# Patient Record
Sex: Male | Born: 1970 | Race: Black or African American | Hispanic: No | Marital: Single | Smoking: Former smoker
Health system: Southern US, Community
[De-identification: ages and names within clinical notes are randomized; demographics above are authoritative.]

## PROBLEM LIST (undated history)

## (undated) DIAGNOSIS — I10 Essential (primary) hypertension: Secondary | ICD-10-CM

## (undated) HISTORY — PX: TONSILLECTOMY: SUR1361

## (undated) HISTORY — DX: Essential (primary) hypertension: I10

---

## 2003-07-02 ENCOUNTER — Emergency Department (HOSPITAL_COMMUNITY): Admission: EM | Admit: 2003-07-02 | Discharge: 2003-07-02 | Payer: Self-pay | Admitting: Emergency Medicine

## 2003-07-13 ENCOUNTER — Ambulatory Visit (HOSPITAL_COMMUNITY): Admission: RE | Admit: 2003-07-13 | Discharge: 2003-07-13 | Payer: Self-pay | Admitting: Unknown Physician Specialty

## 2004-08-07 ENCOUNTER — Ambulatory Visit: Payer: Self-pay | Admitting: Family Medicine

## 2004-09-11 ENCOUNTER — Emergency Department (HOSPITAL_COMMUNITY): Admission: EM | Admit: 2004-09-11 | Discharge: 2004-09-11 | Payer: Self-pay | Admitting: Emergency Medicine

## 2004-09-15 ENCOUNTER — Ambulatory Visit: Payer: Self-pay | Admitting: Family Medicine

## 2004-10-28 ENCOUNTER — Ambulatory Visit: Payer: Self-pay | Admitting: Family Medicine

## 2005-08-11 ENCOUNTER — Ambulatory Visit: Payer: Self-pay | Admitting: Family Medicine

## 2005-10-09 ENCOUNTER — Ambulatory Visit: Payer: Self-pay | Admitting: Family Medicine

## 2007-02-06 ENCOUNTER — Emergency Department (HOSPITAL_COMMUNITY): Admission: EM | Admit: 2007-02-06 | Discharge: 2007-02-06 | Payer: Self-pay | Admitting: Emergency Medicine

## 2007-02-09 ENCOUNTER — Ambulatory Visit: Payer: Self-pay | Admitting: Internal Medicine

## 2007-02-09 ENCOUNTER — Observation Stay (HOSPITAL_COMMUNITY): Admission: EM | Admit: 2007-02-09 | Discharge: 2007-02-10 | Payer: Self-pay | Admitting: Emergency Medicine

## 2007-02-10 ENCOUNTER — Ambulatory Visit: Payer: Self-pay | Admitting: Internal Medicine

## 2007-06-16 ENCOUNTER — Ambulatory Visit: Payer: Self-pay | Admitting: Family Medicine

## 2007-10-14 ENCOUNTER — Emergency Department (HOSPITAL_COMMUNITY): Admission: EM | Admit: 2007-10-14 | Discharge: 2007-10-14 | Payer: Self-pay | Admitting: Emergency Medicine

## 2007-11-03 DIAGNOSIS — I10 Essential (primary) hypertension: Secondary | ICD-10-CM

## 2007-11-03 DIAGNOSIS — F411 Generalized anxiety disorder: Secondary | ICD-10-CM

## 2007-11-03 DIAGNOSIS — F1010 Alcohol abuse, uncomplicated: Secondary | ICD-10-CM | POA: Insufficient documentation

## 2007-11-29 ENCOUNTER — Ambulatory Visit: Payer: Self-pay | Admitting: Family Medicine

## 2007-11-29 ENCOUNTER — Encounter: Payer: Self-pay | Admitting: Family Medicine

## 2008-03-19 ENCOUNTER — Telehealth: Payer: Self-pay | Admitting: Family Medicine

## 2008-04-03 ENCOUNTER — Ambulatory Visit: Payer: Self-pay | Admitting: Family Medicine

## 2008-05-21 ENCOUNTER — Telehealth: Payer: Self-pay | Admitting: Family Medicine

## 2008-07-20 ENCOUNTER — Ambulatory Visit: Payer: Self-pay | Admitting: Family Medicine

## 2008-07-20 DIAGNOSIS — K219 Gastro-esophageal reflux disease without esophagitis: Secondary | ICD-10-CM | POA: Insufficient documentation

## 2008-07-21 DIAGNOSIS — R079 Chest pain, unspecified: Secondary | ICD-10-CM

## 2008-09-11 ENCOUNTER — Telehealth: Payer: Self-pay | Admitting: Family Medicine

## 2008-10-16 ENCOUNTER — Telehealth: Payer: Self-pay | Admitting: Family Medicine

## 2009-04-12 ENCOUNTER — Telehealth: Payer: Self-pay | Admitting: Family Medicine

## 2009-06-05 ENCOUNTER — Telehealth: Payer: Self-pay | Admitting: Family Medicine

## 2009-06-26 ENCOUNTER — Ambulatory Visit: Payer: Self-pay | Admitting: Family Medicine

## 2009-06-26 DIAGNOSIS — R5383 Other fatigue: Secondary | ICD-10-CM

## 2009-06-26 DIAGNOSIS — G4700 Insomnia, unspecified: Secondary | ICD-10-CM

## 2009-06-26 DIAGNOSIS — R5381 Other malaise: Secondary | ICD-10-CM

## 2009-07-09 ENCOUNTER — Telehealth: Payer: Self-pay | Admitting: Family Medicine

## 2010-01-06 ENCOUNTER — Telehealth: Payer: Self-pay | Admitting: Family Medicine

## 2010-02-07 ENCOUNTER — Ambulatory Visit: Payer: Self-pay | Admitting: Family Medicine

## 2010-06-24 NOTE — Progress Notes (Signed)
Summary: BP MEDS  Phone Note Call from Patient   Summary of Call: NEEDS HIS BP MEDICINE FILLED AND HE CAN NOT GET AN APPT TILL 2.3.11 @ 8:00 AM [facility]  PLEASE CALL HIM AND LET HIM KNOW ABOUT THE REFILL AT [phone] Initial call taken by: Lind Guest,  June 05, 2009 2:31 PM  Follow-up for Phone Call        Phone Call Completed, Rx Called In Follow-up by: Worthy Keeler LPN,  June 05, 2009 2:33 PM    Prescriptions: FLUOXETINE HCL 10 MG TABS (FLUOXETINE HCL) Take 1 tablet by mouth once a day  #30 Each x 0   Entered by:   Worthy Keeler LPN   Authorized by:   Syliva Overman MD   Signed by:   Worthy Keeler LPN on 11/91/4782   Method used:   Electronically to        [facility]  [address] (retail)       [address]       [place], [place]  [address]       Ph: [phone]       Fax: [phone]   RxID:   [id number] LISINOPRIL-HYDROCHLOROTHIAZIDE 20-25 MG  TABS (LISINOPRIL-HYDROCHLOROTHIAZIDE) Take 1 tablet by mouth once a day  #30 Each x 0   Entered by:   Worthy Keeler LPN   Authorized by:   Syliva Overman MD   Signed by:   Worthy Keeler LPN on 47/42/5956   Method used:   Electronically to        [facility]  [address] (retail)       [address]       [place], [place]  [address]       Ph: [phone]       Fax: [phone]   RxID:   [id number]

## 2010-06-24 NOTE — Assessment & Plan Note (Signed)
Summary: follow up- room 1   Vital Signs:  Patient profile:   40 year old Cody [name] Height:      69.5 inches Weight:      166.75 pounds BMI:     24.36 O2 Sat:      98 % on Room air Pulse rate:   90 / minute Resp:     16 per minute BP sitting:   126 / 84  (left arm)  Vitals Entered By: Adella Hare LPN (February 07, 2010 9:22 AM) CC: follow-up visit Is Patient Diabetic? No Pain Assessment Patient in pain? no      Comments did not bring meds to ov   Primary Provider:  Syliva Overman MD  CC:  follow-up visit.  History of Present Illness: Pt presents today for check up. No complaints or concerns.  Overall feeling well.  Hx of htn. Taking medications as prescribed and denies side effects.  No headache, chest pain or palpitations.  States has been off of Fluoxetine for a couple of mos and is doing well.  Has not been taking sleeping pills either. Pt states he is not feeling depressed, and is sleeping well.  Has cut his ETOH back significantly. Only drinks 2 regular size cans of beer per night now.  Pt has not gotten labs drawn due to expense.  He has no medical ins.  He is applying for the [employer].    Allergies (verified): 1)  ! * Citolapram  Past History:  Past medical history reviewed for relevance to current acute and chronic problems.  Past Medical History: Reviewed history from 05/03/2007 and no changes required. ALCOHOL USE (ICD-305.00) GENERALIZED ANXIETY DISORDER (ICD-300.02) HYPERTENSION (ICD-401.9)  Review of Systems General:  Denies chills, fever, and loss of appetite. ENT:  Denies earache, nasal congestion, and sore throat. CV:  Denies chest pain or discomfort, lightheadness, and palpitations. Resp:  Denies cough and shortness of breath. GI:  Denies abdominal pain, indigestion, loss of appetite, nausea, and vomiting. Neuro:  Denies headaches. Psych:  Denies anxiety, depression, easily tearful, irritability, suicidal thoughts/plans, and thoughts  /plans of harming others.  Physical Exam  General:  Well-developed,well-nourished,in no acute distress; alert,appropriate and cooperative throughout examination Head:  Normocephalic and atraumatic without obvious abnormalities. No apparent alopecia or balding. Ears:  External ear exam shows no significant lesions or deformities.  Otoscopic examination reveals clear canals, tympanic membranes are intact bilaterally without bulging, retraction, inflammation or discharge. Hearing is grossly normal bilaterally. Nose:  External nasal examination shows no deformity or inflammation. Nasal mucosa are pink and moist without lesions or exudates. Mouth:  Oral mucosa and oropharynx without lesions or exudates.   Neck:  No deformities, masses, or tenderness noted. Lungs:  Normal respiratory effort, chest expands symmetrically. Lungs are clear to auscultation, no crackles or wheezes. Heart:  Normal rate and regular rhythm. S1 and S2 normal without gallop, murmur, click, rub or other extra sounds. Cervical Nodes:  No lymphadenopathy noted Psych:  Cognition and judgment appear intact. Alert and cooperative with normal attention span and concentration. No apparent delusions, illusions, hallucinations   Impression & Recommendations:  Problem # 1:  HYPERTENSION (ICD-401.9) Assessment Comment Only  His updated medication list for this problem includes:    Lisinopril-hydrochlorothiazide 20-Cody Mg Tabs (Lisinopril-hydrochlorothiazide) .Marland Kitchen... Take 1 tablet by mouth once a day  BP today: 126/84 Prior BP: 120/82 (06/26/2009)  Problem # 2:  ALCOHOL USE (ICD-305.00) Assessment: Improved  Complete Medication List: 1)  Lisinopril-hydrochlorothiazide 20-Cody Mg Tabs (Lisinopril-hydrochlorothiazide) .... Take 1  tablet by mouth once a day  Patient Instructions: 1)  Please schedule a follow-up appointment in 4 months. 2)  Continue taking your blood pressure medicine daily. 3)  Consider having your lab work drawn.  If  you qualify for the [facility] Discount this will apply to the lab work too. Prescriptions: LISINOPRIL-HYDROCHLOROTHIAZIDE 20-Cody MG  TABS (LISINOPRIL-HYDROCHLOROTHIAZIDE) Take 1 tablet by mouth once a day  #30 x 5   Entered and Authorized by:   Esperanza Sheets PA   Signed by:   Esperanza Sheets PA on 02/07/2010   Method used:   Electronically to        [facility]  [address] (retail)       [address]       [place], [place]  [address]       Ph: [phone]       Fax: [phone]   RxID:   [id number]  Pt declined Flu Vac. Esperanza Sheets PA  February 07, 2010 9:38 AM

## 2010-06-24 NOTE — Assessment & Plan Note (Signed)
Summary: NEEDS MEDICINE   Vital Signs:  Patient profile:   40 year old male Height:      69.5 inches Weight:      165 pounds BMI:     24.10 O2 Sat:      97 % Pulse rate:   90 / minute Pulse rhythm:   regular Resp:     16 per minute BP sitting:   120 / 82 Cuff size:   regular  Vitals Entered By: Everitt Amber (June 26, 2009 8:14 AM) CC: Follow up chronic problems Is Patient Diabetic? No   Primary Care Provider:  Syliva Overman MD  CC:  Follow up chronic problems.  History of Present Illness: Reports  that he has been doing very well. Denies recent fever or chills. Denies sinus pressure, nasal congestion , ear pain or sore throat. Denies chest congestion, or cough productive of sputum. Denies chest pain, palpitations, PND, orthopnea or leg swelling. Denies abdominal pain, nausea, vomitting, diarrhea or constipation. Denies change in bowel movements or bloody stool. Denies dysuria , frequency, incontinence or hesitancy. Denies  joint pain, swelling, or reduced mobility. Denies headaches, vertigo, seizures. Denies depression, anxiety or insomnia.Pt states that he is doing very well on his medication , and wants to continue sAME. Denies  rash, lesions, or itch.     Current Medications (verified): 1)  Lisinopril-Hydrochlorothiazide 20-25 Mg  Tabs (Lisinopril-Hydrochlorothiazide) .... Take 1 Tablet By Mouth Once A Day 2)  Fluoxetine Hcl 10 Mg Tabs (Fluoxetine Hcl) .... Take 1 Tablet By Mouth Once A Day  Allergies (verified): 1)  ! * Citolapram  Review of Systems      See HPI General:  Complains of sleep disorder; POOR SLEEP. Eyes:  Denies blurring and discharge. Endo:  Denies cold intolerance, excessive hunger, excessive thirst, excessive urination, heat intolerance, polyuria, and weight change. Heme:  Denies abnormal bruising and bleeding. Allergy:  Denies hives or rash and itching eyes.  Physical Exam  General:  Well-developed,well-nourished,in no acute  distress; alert,appropriate and cooperative throughout examination HEENT: No facial asymmetry,  EOMI, No sinus tenderness, TM's Clear, oropharynx  pink and moist.   Chest: Clear to auscultation bilaterally.  CVS: S1, S2, No murmurs, No S3.   Abd: Soft, Nontender.  MS: Adequate ROM spine, hips, shoulders and knees.  Ext: No edema.   CNS: CN 2-12 intact, power tone and sensation normal throughout.   Skin: Intact, no visible lesions or rashes.  Psych: Good eye contact, normal affect.  Memory intact, not anxious or depressed appearing.      Impression & Recommendations:  Problem # 1:  HYPERTENSION (ICD-401.9) Assessment Improved  His updated medication list for this problem includes:    Lisinopril-hydrochlorothiazide 20-25 Mg Tabs (Lisinopril-hydrochlorothiazide) .Marland Kitchen... Take 1 tablet by mouth once a day  Orders: T-Basic Metabolic Panel [id number])  BP today: 120/82 Prior BP: 120/90 (07/20/2008)  Problem # 2:  GENERALIZED ANXIETY DISORDER (ICD-300.02) Assessment: Improved  His updated medication list for this problem includes:    Fluoxetine Hcl 10 Mg Tabs (Fluoxetine hcl) .Marland Kitchen... Take 1 tablet by mouth once a day  Problem # 3:  GERD (ICD-530.81) Assessment: Unchanged  The following medications were removed from the medication list:    Omeprazole 40 Mg Cpdr (Omeprazole) .Marland Kitchen... Take 1 capsule by mouth once a day  Problem # 4:  ALCOHOL USE (ICD-305.00) Assessment: Improved  Problem # 5:  INSOMNIA (ICD-780.52) Assessment: Improved  His updated medication list for this problem includes:    Temazepam 15 Mg  Caps (Temazepam) .Marland Kitchen... Take 1 capsule by mouth at bedtime  Complete Medication List: 1)  Lisinopril-hydrochlorothiazide 20-25 Mg Tabs (Lisinopril-hydrochlorothiazide) .... Take 1 tablet by mouth once a day 2)  Fluoxetine Hcl 10 Mg Tabs (Fluoxetine hcl) .... Take 1 tablet by mouth once a day 3)  Temazepam 15 Mg Caps (Temazepam) .... Take 1 capsule by mouth at  bedtime  Other Orders: T-Lipid Profile ([id number]) T-TSH (62130-86578) T-CBC w/Diff [phone])  Patient Instructions: 1)  CPE in 4.5 months 2)  BMP prior to visit, ICD-9: 3)  Lipid Panel prior to visit, ICD-9:   fasting 4)  TSH prior to visit, ICD-9: 5)  CBC w/ Diff prior to visit, ICD-9: 6)  New med for sleep. 7)  PLS cut down to at most 1 beer at night, then plan on quitting by 02/14, you will get med to help you rest Prescriptions: FLUOXETINE HCL 10 MG TABS (FLUOXETINE HCL) Take 1 tablet by mouth once a day  #30 Each x 5   Entered by:   Everitt Amber   Authorized by:   Syliva Overman MD   Signed by:   Everitt Amber on 06/26/2009   Method used:   Electronically to        [facility]  [address] (retail)       [address]       [place], [place]  [address]       Ph: [phone]       Fax: [phone]   RxID:   [id number] LISINOPRIL-HYDROCHLOROTHIAZIDE 20-25 MG  TABS (LISINOPRIL-HYDROCHLOROTHIAZIDE) Take 1 tablet by mouth once a day  #30 Each x 5   Entered by:   Everitt Amber   Authorized by:   Syliva Overman MD   Signed by:   Everitt Amber on 06/26/2009   Method used:   Electronically to        [facility]  [address] (retail)       [address]       [place], [place]  [address]       Ph: [phone]       Fax: [phone]   RxID:   [id number] TEMAZEPAM 15 MG CAPS (TEMAZEPAM) Take 1 capsule by mouth at bedtime  #30 x 4   Entered and Authorized by:   Syliva Overman MD   Signed by:   Syliva Overman MD on 06/26/2009   Method used:   Printed then faxed to ...       [facility]  [address] (retail)       [address]       [place], [place]  [address]       Ph: [phone]       Fax: [phone]   RxID:   [id number]

## 2010-06-24 NOTE — Progress Notes (Signed)
Summary: refill on rx  Phone Note Call from Patient   Summary of Call: needs a refill called into [facility] for blood pressure meds. he ask them to fax too. [phone] Initial call taken by: Rudene Anda,  July 09, 2009 11:47 AM    Prescriptions: LISINOPRIL-HYDROCHLOROTHIAZIDE 20-25 MG  TABS (LISINOPRIL-HYDROCHLOROTHIAZIDE) Take 1 tablet by mouth once a day  #30 Each x 5   Entered by:   Lilyan Gilford LPN   Authorized by:   Syliva Overman MD   Signed by:   Lilyan Gilford LPN on 45/40/9811   Method used:   Electronically to        [facility]  [address] (retail)       [address]       [place], [place]  [address]       Ph: [phone]       Fax: [phone]   RxID:   [id number]

## 2010-06-24 NOTE — Progress Notes (Signed)
Summary: refill   Phone Note Call from Patient   Summary of Call: pt needs autherzation on his blood pressure meds.  [facility]  [phone] Initial call taken by: Rudene Anda,  January 06, 2010 4:14 PM  Follow-up for Phone Call        advised patient needs appt Follow-up by: Adella Hare LPN,  January 07, 2010 9:25 AM

## 2010-08-06 ENCOUNTER — Telehealth (INDEPENDENT_AMBULATORY_CARE_PROVIDER_SITE_OTHER): Payer: Self-pay | Admitting: *Deleted

## 2010-08-12 NOTE — Progress Notes (Signed)
Summary: refill  Phone Note Call from Patient   Summary of Call: pt needs a refill on rx for blood pressure. [facility] [phone] Initial call taken by: Rudene Anda,  August 06, 2010 3:25 PM  Follow-up for Phone Call        please have patient schedule appt first Follow-up by: Adella Hare LPN,  August 06, 2010 3:36 PM  Additional Follow-up for Phone Call Additional follow up Details #1::        called and left message for pt to call office to make a doc appt. before he could get any refills Additional Follow-up by: Rudene Anda,  August 07, 2010 10:36 AM

## 2010-10-07 NOTE — H&P (Signed)
NAMEPAOLO, Cody [name]             ACCOUNT NO.:  [id number]   MEDICAL RECORD NO.:  [id number]          PATIENT TYPE:  OBV   LOCATION:  A223                          FACILITY:  [facility]   PHYSICIAN:  Skeet Latch, DO    DATE OF BIRTH:  11/10/1970   DATE OF ADMISSION:  02/09/2007  DATE OF DISCHARGE:  LH                              HISTORY & PHYSICAL   PRIMARY CARE PHYSICIAN:  Dr. Lodema Hong.   CHIEF COMPLAINT:  Vomiting, headache.   HISTORY OF PRESENT ILLNESS:  This is a 40 year old African-American male  who presented after having bloody emesis. The patient states last night  after work he had a few drinks and began to experience severe headaches.  The patient states that he came to [facility] emergency room on  February 06, 2007 with the complaints of a headache and was given a  prescription for blood pressure medication and sent home. The patient  states that he has had a headache consistently since then that has been  waxing and waning and states that last night he also began experiencing  tingling and numbness of his left arm and feet. The patient states that  after these symptoms he just fell asleep, and he when awoke this morning  he still had the sensation of headache and began to have the bloody  emesis. The patient came to the emergency room for evaluation. He was  found to have hematemesis in the emergency room, and we were called for  evaluation.   The patient's past medical history includes:  1. Hypertension.  2. ETOH abuse.  3. Anxiety.   PAST SURGICAL HISTORY:  Tonsillectomy.   MEDICATIONS:  Hydrochlorothiazide 25 mg p.o. daily.   SOCIAL HISTORY:  States he smokes 3 to 4 cigarettes a day for the past  six months. States that he quit smoking a few years ago and restarted  approximately 6 months ago. The patient states he drinks beer and a  small amount of liquor on a daily basis for quite some time also. She  denies any illicit drug use.   FAMILY HISTORY:   Positive for hypertension.   ALLERGIES:  No known drug allergies.   REVIEW OF SYSTEMS:  GENERAL:  Has slight decrease in appetite. No weight  loss, fevers, chills, dizziness. HEENT:  No ear pain, hoarseness, sinus  pressure, sore throat, diplopia, eye pain. RESPIRATORY:  Denies any  coughing, wheezing. GASTROINTESTINAL:  Positive nausea and vomiting. No  abdominal pain. GENITOURINARY:  No dysuria, flank pain or frequency.  MUSCULOSKELETAL:  No arthritis, arthralgias, neck pain. NEUROLOGICAL:  Positive for occipital headache. No confusion or lightheadedness. All  other systems negative.   PHYSICAL EXAMINATION:  VITAL SIGNS:  The last vitals in the emergency  room:  Temperature was 98.3, respirations 20, pulse 76, blood pressure  129/89.  HEENT:  Head is atraumatic and normocephalic. No scleral icterus.  PERRLA. EOMI. Oral mucosa is moist.  NECK:  Soft, supple, nontender, nondistended.  GENERAL:  He is well-developed __________ well-nourished in no acute  distress.  CARDIOVASCULAR:  Regular rate and rhythm. No murmurs, rubs, or gallops.  RESPIRATORY:  Lungs were clear to auscultation bilaterally. No rubs,  rhonchi or wheezes.  ABDOMEN:  Soft, nontender, nondistended. No masses, guarding, rebound,  tenderness or rigidity. Good bowel sounds.  EXTREMITIES:  No clubbing, cyanosis, or edema. Full range of motion.  NEUROLOGICAL:  Cranial nerves II-XII are grossly intact. Good sensation.  Good strength bilaterally.  SKIN:  No petechiae, rashes.   LABORATORY DATA:  PT 12.2, INR 0.9. Sodium 140, potassium 3.3, chloride  99, CO2 26, glucose 94, BUN 7, creatinine 1.02. Lipase is 21, amylase is  123. Urine drug screen is negative. His white count 3.8, hemoglobin  15.4, hematocrit 43.9, platelets 288. His alcohol level was less than 4.   ASSESSMENT:  1. Hematemesis.  2. Alcohol abuse.  3. Hypertension.  4. Anxiety.   PLAN:  The patient will be admitted to medical floor. The patient will   be kept n.p.o. at this time. The patient will have IV hydration and will  receive no aspirin or NSAIDs at this time. The patient will be placed on  PPI IV. GI has been consulted regarding need for possible EGD, also. The  patient will be placed on his home medications as well as medications  for pain at this time. I feel patient more than likely has gastritis-  type picture, but we need to rule out for varices at this time.  A.m.  labs will include CBC, BMP, PT, PTT, and as stated, the patient will be  kept n.p.o. pending his EGD. Anticipate patient will be discharged in  the next 24 or 48 hours if he remains stable and his GI evaluation is  unremarkable.      Skeet Latch, DO  Electronically Signed     SM/MEDQ  D:  02/09/2007  T:  02/10/2007  Job:  [id number]   cc:   Milus Mallick. Lodema Hong, M.D.  Fax: [phone]

## 2010-10-07 NOTE — Consult Note (Signed)
[name], Cody [name]             ACCOUNT NO.:  [id number]   MEDICAL RECORD NO.:  [id number]          PATIENT TYPE:  OBV   LOCATION:  A223                          FACILITY:  [facility]   PHYSICIAN:  R. Roetta Sessions, M.D. DATE OF BIRTH:  Apr 10, 1971   DATE OF CONSULTATION:  02/09/2007  DATE OF DISCHARGE:                                 CONSULTATION   REASON FOR CONSULTATION:  Upper GI bleed.   REFERRING PHYSICIAN:  [facility].   HISTORY OF PRESENT ILLNESS:  The patient is a 40 year old gentleman with  a history of alcohol abuse who presented with complaints of nausea,  vomiting, not feeling well with numbness in his left upper extremity and  his feet. He admitted to drinking significant amounts of alcohol and he  thought he may have alcohol poisoning. While he was in the emergency  department, he was noted the have gross hematemesis. He states he  consumes 72 ounces of beer daily and 1 or 2 shots of liquor daily. He  has done this for over 5 years. Prior to this, he states he drank even  more heavily. He denies any illegal drug use. He smokes 1 pack of  cigarettes every week. He denies any chronic heartburn, chronic nausea  and vomiting, abdominal pain, melena, rectal bleeding, constipation and  diarrhea. No reported weight loss.   CURRENT MEDICATIONS:  HCTZ 25 mg daily.   ALLERGIES:  No known drug allergies.   PAST MEDICAL HISTORY:  1. Hypertension.  2. Alcohol abuse.  3. Tobacco abuse.  4. Tonsillectomy.  5. Anxiety. History of panic attacks.   FAMILY HISTORY:  Positive for hypertension, negative for colorectal  cancer or liver disease, chronic GI illnesses.   SOCIAL HISTORY:  Single, has a 76-year-old daughter. 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 He smokes 1 pack of  cigarettes over a 1 week period of time. Alcohol abuse as above. No  illegal drug use.   REVIEW OF SYSTEMS:  See HPI for GI and constitutional. CARDIOPULMONARY:  Denies  chest pain or shortness of breath.   PHYSICAL EXAMINATION:  Temperature 98.3, pulse 76, respirations 20,  blood pressure 129/89.  GENERAL:  Pleasant, thin, black male in no acute distress.  SKIN:  Warm and dry, no jaundice.  HEENT:  Sclera nonicteric. Oropharyngeal mucosa moist and pink. No  lesions, erythema or exudates, no lymphadenopathy or thyromegaly.  CHEST:  Lungs are clear to auscultation.  CARDIAC:  Reveals a regular rate and rhythm, normal S1, S2. No murmurs,  rubs or gallops.  ABDOMEN:  Positive bowel sounds. Soft, nontender, nondistended. No  organomegaly or masses. No rebound tenderness or guarding. No abdominal  bruits. No hernias.  EXTREMITIES:  No edema.   LABORATORY DATA:  White count 3800, hemoglobin 16.4, hematocrit 43.9,  platelets 288,000. INR 0.9, sodium 140, potassium 3.3. BUN 7, creatinine  1.02, glucose 94. Alcohol level 74, lipase 21, amylase 123.   IMPRESSION:  The patient is a 40 year old gentleman with alcohol abuse.  He is admitted with nausea and vomiting, hematemesis. H&H is currently  normal.  Differential diagnosis includes Mallory-Weiss tear versus peptic  ulcer disease. I doubt we are dealing with esophageal varices.   RECOMMENDATIONS:  1. EGD in the morning.  2. PPI therapy.  3. Consider DT protocol.   I would like thank the [facility] for allowing [place] to take part in  the care of this patient.      Tana Coast, P.AJonathon Bellows, M.D.  Electronically Signed    LL/MEDQ  D:  02/09/2007  T:  02/09/2007  Job:  [id number]   cc:   Milus Mallick. Lodema Hong, M.D.  Fax: [phone]   [facility]

## 2010-10-07 NOTE — Op Note (Signed)
[name], Cody [name]             ACCOUNT NO.:  [id number]   MEDICAL RECORD NO.:  [id number]          PATIENT TYPE:  OBV   LOCATION:  A223                          FACILITY:  [facility]   PHYSICIAN:  R. Roetta Sessions, M.D. DATE OF BIRTH:  08/27/1970   DATE OF PROCEDURE:  02/10/2007  DATE OF DISCHARGE:                               OPERATIVE REPORT   INDICATIONS FOR PROCEDURE:  A 40 year old African-American male with  ongoing EtOH abuse who presents with nausea, vomiting, small volume  hematemesis.  H&H has remained normal overnight.  His hemoglobin this  morning is 14.5.  He has also remained entirely stable.  EGD is now  being done.  This approach has discussed the patient at length.  Potential risks, benefits and alternatives have been reviewed, questions  answered.  He is agreeable. Please see documentation in the medical  record.   PROCEDURE NOTE:  O2 saturation, blood pressure, pulse and respirations  monitored throughout the entire procedure.  Conscious sedation with  Versed 4 mg IV, Demerol 100 mg IV in divided doses.  Cetacaine spray for  topical oropharyngeal anesthesia.   INSTRUMENT:  Pentax video chip system.   FINDINGS:  Examination of the tubular esophagus revealed patulous EG  junction and 4-cm distal esophageal erosions.  There were no varices, no  Barrett's esophagus and no Mallory-Weiss tears.  No blood in the  esophagus.  EG junction easily traversed.   Stomach:  Likewise was no blood in the stomach.  Stomach was emptied and  insufflated well with air.  Throughout examination of the gastric mucosa  including retroflexion to view the proximal stomach and esophagogastric  junction demonstrated moderate size hiatal hernia and some submucosal  petechial hemorrhage in the area of the body mucosa.  There was no ulcer  or other abnormality.  Pylorus was patent and easily traversed.  Examination of the bulb and second portion revealed no abnormalities.   THERAPEUTIC/DIAGNOSTIC MANEUVERS PERFORMED:  None.   The patient tolerated the procedure well and was reactive to endoscopy.   IMPRESSION:  1. Multiple distal esophageal erosions consistent with erosive reflux      esophagitis, otherwise normal esophagus/patulous EG junction.  2. Moderate size hiatal hernia, submucosal gastric hemorrhage,      otherwise normal stomach, patent pylorus normal, normal D1/D2.   I suspect the patient has sustained a trivial upper GI bleed.  He has  likely had a Mallory-Weiss tear equivalent with some trauma to the  proximal gastric mucosa related to heaving/vomiting.   RECOMMENDATIONS:  1. I have frankly told him to avoid alcohol entirely.  2. PPI indefinitely.  3. Advance to regular diet.  4. Home anytime.   I would like to thank the [facility] Team and Dr. Bradd Canary for allowing me to see this nice gentleman today.      Jonathon Bellows, M.D.  Electronically Signed     RMR/MEDQ  D:  02/10/2007  T:  02/10/2007  Job:  [id number]   cc:   Alvin Critchley, M.D.  [address]  [place], [place] [address]   Hospitalist P Team

## 2010-10-10 NOTE — Discharge Summary (Signed)
Cody [name], Cody [name]             ACCOUNT NO.:  [id number]   MEDICAL RECORD NO.:  [id number]          PATIENT TYPE:  OBV   LOCATION:  A223                          FACILITY:  [facility]   PHYSICIAN:  Skeet Latch, DO    DATE OF BIRTH:  Jan 10, 1971   DATE OF ADMISSION:  02/09/2007  DATE OF DISCHARGE:  09/18/2008LH                               DISCHARGE SUMMARY   DISCHARGE DIAGNOSES:  1. Hematemesis.  2. Erosive reflux esophagitis.  3. EtOH abuse.  4. Hypertension.  5. Anxiety.   BRIEF HOSPITAL COURSE:  A 40 year old [place] American male who  presented after having multiple episodes of bloody emesis.  The patient  states that the evening before admission that after work he had a few  drinks began to experience severe headaches.  The patient states that  after that he came to the emergency room with the complaint of headache;  and was given a prescription for his blood pressure medication and sent  home.  The patient states that after he had a headache, that the  headaches persisted, and then he had an episode of some tingling and  numbness of his left arm and foot.  The patient states that he went to  bed.  He was awoken with this headache and started to be having some  bloody emesis.  The patient came to the ER for evaluation, and in the  emergency room he was found to have some emesis and we were called for  evaluation.  The patient does have a history of EtOH abuse.   Initial workup ethyl alcohol level of 74.  Initial CBC showed a white  count of 3.8, hemoglobin 15.4, hematocrit 43.9, platelet count 288.  His  urine drug screen was normal.  Amylase 123, lipase 21, GI was consulted  secondary to the patient's bloody emesis.  They performed an EGD which  showed:  (1) Multiple distal esophageal erosions consistent with a  erosive reflux esophagitis, otherwise normal esophagus. (2) Moderate  size hiatal hernia, submucosal gastric hemorrhage, otherwise normal  stomach.  It is  recommended the patient avoid any alcohol pain, and he  was placed on a PPI.  His diet was advanced.  The patient was stable,  and had no more episodes of bloody emesis and anticipated discharge.   VITALS ON DISCHARGE:  Temperature 98.3, pulse was 78; respirations 13;  blood pressure 119/77; and he was saturating 95% on room.   LABS ON DISCHARGE:  Sodium 141, potassium 4.0, chloride 107, CO2 25,  glucose 90, BUN 8, creatinine 1.17 PTT was 30.  PT 13.2, INR 1.0, white  count 7.1, hemoglobin 14.5, hematocrit 43.6 platelet count 291.   DISCHARGE CONDITION:  Stable.   DISPOSITION:  The patient was discharged to home.   MEDICATIONS ON DISCHARGE:  1. Omeprazole 20 mg daily.  2. Omega-2 fatty acids daily.  3. HCTZ 25 mg daily   INSTRUCTIONS:  The patient was told to do a clear liquid diet, advance  as tolerated.  No restrictions on his activity.  The patient was told to  follow with Dr. Lodema Hong in approximately 7  days.  The patient was  instructed to discontinue all alcoholic beverages.      Skeet Latch, DO  Electronically Signed     SM/MEDQ  D:  02/13/2007  T:  02/14/2007  Job:  [id number]   cc:   Milus Mallick. Lodema Hong, M.D.  Fax: [phone]

## 2011-01-27 ENCOUNTER — Telehealth: Payer: Self-pay | Admitting: Family Medicine

## 2011-01-28 ENCOUNTER — Ambulatory Visit (INDEPENDENT_AMBULATORY_CARE_PROVIDER_SITE_OTHER): Payer: Self-pay | Admitting: Family Medicine

## 2011-01-28 ENCOUNTER — Encounter: Payer: Self-pay | Admitting: Family Medicine

## 2011-01-28 VITALS — BP 124/98 | HR 118 | Resp 16 | Ht 70.5 in | Wt 170.0 lb

## 2011-01-28 DIAGNOSIS — F1010 Alcohol abuse, uncomplicated: Secondary | ICD-10-CM

## 2011-01-28 DIAGNOSIS — I10 Essential (primary) hypertension: Secondary | ICD-10-CM

## 2011-01-28 DIAGNOSIS — R5381 Other malaise: Secondary | ICD-10-CM

## 2011-01-28 DIAGNOSIS — R220 Localized swelling, mass and lump, head: Secondary | ICD-10-CM

## 2011-01-28 DIAGNOSIS — Z13220 Encounter for screening for lipoid disorders: Secondary | ICD-10-CM

## 2011-01-28 DIAGNOSIS — Z125 Encounter for screening for malignant neoplasm of prostate: Secondary | ICD-10-CM

## 2011-01-28 MED ORDER — AMLODIPINE BESYLATE 2.5 MG PO TABS: 2.5000 mg | ORAL_TABLET | Freq: Every day | ORAL | Status: DC

## 2011-01-28 MED ORDER — LISINOPRIL-HYDROCHLOROTHIAZIDE 20-25 MG PO TABS: 1.0000 | ORAL_TABLET | Freq: Every day | ORAL | Status: DC

## 2011-01-28 MED ORDER — CEPHALEXIN 500 MG PO CAPS: 500.0000 mg | ORAL_CAPSULE | Freq: Three times a day (TID) | ORAL | Status: AC

## 2011-01-28 NOTE — Assessment & Plan Note (Signed)
Medication compliance addressed. Commitment to regular exercise and healthy  food choices, with portion control discussed. DASH diet and low fat diet discussed and literature offered. Changes in medication made at this visit.  

## 2011-01-28 NOTE — Assessment & Plan Note (Signed)
Cellulitis, antibiotic prescribed

## 2011-01-28 NOTE — Assessment & Plan Note (Signed)
Improved, reports drinks 3 nights per week on avg 3 to 4 beers

## 2011-01-28 NOTE — Patient Instructions (Signed)
F/u in 5.5 months.  Antibiotic is prescribed for 10 days for facial swelling. If it persists or worsens you need to see a dentist. I see no obvious signs of dental infection at this time. OK to take 1 aleve twice daily for pain, samples will be provided (12)  Blood pressure is high, continue current medication and an additional tablet is started also  Facstin chem 7, cbc , lipid and PSa in 5.5 months just before next visit.  Pls reduce alcohol use.

## 2011-01-28 NOTE — Telephone Encounter (Signed)
PATIENT WAS SEEN IN OFFICE YESTERDAY

## 2011-01-28 NOTE — Progress Notes (Signed)
  Subjective:    Patient ID: Cody [name], male    DOB: 1970-12-22, 40 y.o.   MRN: [id number]  HPI 2 day h/o painful swelling of right jaw, no fever or chills, denies dental pain , gum pain or abcess. Reports less alcohol use , and states he is compliant with blood pressure medication.   Review of Systems Denies recent fever or chills. Denies sinus pressure, nasal congestion, ear pain or sore throat. Denies chest congestion, productive cough or wheezing. Denies chest pains, palpitations and leg swelling Denies abdominal pain, nausea, vomiting,diarrhea or constipation.   Denies dysuria, frequency, hesitancy or incontinence. Denies joint pain, swelling and limitation in mobility. Denies headaches, seizures, numbness, or tingling. Denies depression, anxiety or insomnia. Denies skin break down or rash.        Objective:   Physical Exam Patient alert and oriented and in no cardiopulmonary distress.  HEENT: No facial asymmetry, EOMI, no sinus tenderness,  oropharynx pink and moist.  Neck supple no adenopathy. Right lower jaw swelling , with mild warmth and tenderness. No evidence of dental caries  Chest: Clear to auscultation bilaterally.  CVS: S1, S2 no murmurs, no S3.  ABD: Soft non tender. Bowel sounds normal.  Ext: No edema  MS: Adequate ROM spine, shoulders, hips and knees.  Skin: Intact, no ulcerations or rash noted.  Psych: Good eye contact, normal affect. Memory intact not anxious or depressed appearing.  CNS: CN 2-12 intact, power, tone and sensation normal throughout.        Assessment & Plan:

## 2011-02-18 LAB — CBC
HCT: 42.8
Platelets: 312
RDW: 13.2
WBC: 6.6

## 2011-02-18 LAB — COMPREHENSIVE METABOLIC PANEL
AST: 52 — ABNORMAL HIGH
Albumin: 3.9
Alkaline Phosphatase: 36 — ABNORMAL LOW
BUN: 12
Creatinine, Ser: 1.16
GFR calc Af Amer: >60
Potassium: 3.3 — ABNORMAL LOW
Total Protein: 7.2

## 2011-02-18 LAB — DIFFERENTIAL
Eosinophils Relative: 1
Lymphocytes Relative: 21
Monocytes Absolute: 1
Monocytes Relative: 15 — ABNORMAL HIGH
Neutro Abs: 4.2

## 2011-02-18 LAB — POCT CARDIAC MARKERS
CKMB, poc: 1.3
Troponin i, poc: <0.05

## 2011-03-05 LAB — BASIC METABOLIC PANEL
BUN: 7
CO2: 25
Calcium: 9
Chloride: 99
GFR calc Af Amer: >60
GFR calc non Af Amer: >60
Potassium: 3.3 — ABNORMAL LOW
Sodium: 141

## 2011-03-05 LAB — CBC
Hemoglobin: 14.5
MCHC: 33.4
RBC: 5.03
RBC: 5.06
WBC: 3.8 — ABNORMAL LOW

## 2011-03-05 LAB — DIFFERENTIAL
Basophils Relative: 0
Eosinophils Relative: 2
Lymphocytes Relative: 26
Lymphocytes Relative: 41
Monocytes Absolute: 1.3 — ABNORMAL HIGH
Monocytes Relative: 18 — ABNORMAL HIGH
Monocytes Relative: 19 — ABNORMAL HIGH
Neutro Abs: 3.9
Neutrophils Relative %: 38 — ABNORMAL LOW
Promyelocytes Absolute: 0

## 2011-03-05 LAB — ETHANOL: Alcohol, Ethyl (B): 74 — ABNORMAL HIGH

## 2011-03-05 LAB — LIPASE, BLOOD: Lipase: 21

## 2011-03-05 LAB — PROTIME-INR: Prothrombin Time: 12.2

## 2011-03-05 LAB — RAPID URINE DRUG SCREEN, HOSP PERFORMED
Barbiturates: NOT DETECTED
Benzodiazepines: NOT DETECTED
Cocaine: NOT DETECTED
Opiates: NOT DETECTED

## 2011-03-05 LAB — APTT: aPTT: 30

## 2011-09-07 ENCOUNTER — Other Ambulatory Visit: Payer: Self-pay | Admitting: Family Medicine

## 2011-10-09 ENCOUNTER — Encounter: Payer: Self-pay | Admitting: Family Medicine

## 2011-10-09 ENCOUNTER — Ambulatory Visit (INDEPENDENT_AMBULATORY_CARE_PROVIDER_SITE_OTHER): Payer: Self-pay | Admitting: Family Medicine

## 2011-10-09 VITALS — BP 132/90 | HR 116 | Resp 18 | Ht 70.5 in | Wt 168.1 lb

## 2011-10-09 DIAGNOSIS — I10 Essential (primary) hypertension: Secondary | ICD-10-CM

## 2011-10-09 DIAGNOSIS — F1010 Alcohol abuse, uncomplicated: Secondary | ICD-10-CM

## 2011-10-09 LAB — COMPREHENSIVE METABOLIC PANEL
ALT: 267 U/L — ABNORMAL HIGH (ref 0–53)
Albumin: 4.9 g/dL (ref 3.5–5.2)
CO2: 25 mEq/L (ref 19–32)
Calcium: 10.4 mg/dL (ref 8.4–10.5)
Chloride: 99 mEq/L (ref 96–112)
Glucose, Bld: 85 mg/dL (ref 70–99)
Sodium: 136 mEq/L (ref 135–145)
Total Protein: 8.7 g/dL — ABNORMAL HIGH (ref 6.0–8.3)

## 2011-10-09 MED ORDER — LISINOPRIL-HYDROCHLOROTHIAZIDE 20-25 MG PO TABS: 1.0000 | ORAL_TABLET | Freq: Every day | ORAL | Status: DC

## 2011-10-09 NOTE — Patient Instructions (Signed)
Please give him information about the [facility] discount Continue blood pressure medication Try to get the labs done F/U 6 months

## 2011-10-11 ENCOUNTER — Encounter: Payer: Self-pay | Admitting: Family Medicine

## 2011-10-11 NOTE — Assessment & Plan Note (Signed)
Will continue him on Lisinopril/HCTZ, norvasc was on list but pt never took this medication will obtain metabolic panel, he needs Lipid panel as well but unable to afford, so we will work him. Given information for assitance

## 2011-10-11 NOTE — Progress Notes (Signed)
  Subjective:    Patient ID: Cody [name], male    DOB: May 31, 1970, 41 y.o.   MRN: [id number]  HPI  Pt here to f/u hypertension, he has not been in for some time. Taking HCTZ/Lisinopril, he has not had any bloodwork done due to no insurance. He continues to drink but has cut back to 24 ounces a day previously upwards of 72 ounces a day.He does not think it is a problem   Review of Systems  GEN- denies fatigue, fever, weight loss,weakness, recent illness HEENT- denies eye drainage, change in vision, nasal discharge, CVS- denies chest pain, palpitations RESP- denies SOB, cough, wheeze ABD- denies N/V, change in stools, abd pain GU- denies dysuria, hematuria, dribbling, incontinence MSK- denies joint pain, muscle aches, injury Neuro- denies headache, dizziness, syncope, seizure activity       Objective:   Physical Exam GEN- NAD, alert and oriented x3 HEENT- PERRL, EOMI, non injected sclera, pink conjunctiva, MMM, oropharynx clear Neck- Supple,  CVS- RRR, no murmur RESP-CTAB ABD-NABS,soft, NT,ND,no HSM EXT- No edema Pulses- Radial, DP- 2+        Assessment & Plan:

## 2011-10-11 NOTE — Assessment & Plan Note (Signed)
He has decreased his alcohol use but is in denial about it being a problem for him. Continue to encourage cessation LFT will be obtained

## 2019-01-02 ENCOUNTER — Other Ambulatory Visit: Payer: Self-pay

## 2019-01-02 DIAGNOSIS — Z20822 Contact with and (suspected) exposure to covid-19: Secondary | ICD-10-CM

## 2019-01-03 LAB — NOVEL CORONAVIRUS, NAA: SARS-CoV-2, NAA: NOT DETECTED

## 2019-05-02 ENCOUNTER — Observation Stay (HOSPITAL_COMMUNITY): Payer: Self-pay | Admitting: Anesthesiology

## 2019-05-02 ENCOUNTER — Encounter (HOSPITAL_COMMUNITY): Payer: Self-pay | Admitting: Emergency Medicine

## 2019-05-02 ENCOUNTER — Other Ambulatory Visit: Payer: Self-pay

## 2019-05-02 ENCOUNTER — Encounter (HOSPITAL_COMMUNITY)
Admission: EM | Disposition: A | Discharge status: Home / Self Care | Payer: Self-pay | Source: Home / Self Care | Attending: Internal Medicine

## 2019-05-02 ENCOUNTER — Inpatient Hospital Stay (HOSPITAL_COMMUNITY)
Admission: EM | Admit: 2019-05-02 | Discharge: 2019-05-05 | DRG: 369 | Disposition: A | Discharge status: Home / Self Care | Payer: Self-pay | Attending: Internal Medicine | Admitting: Internal Medicine

## 2019-05-02 ENCOUNTER — Emergency Department (HOSPITAL_COMMUNITY): Payer: Self-pay

## 2019-05-02 DIAGNOSIS — K922 Gastrointestinal hemorrhage, unspecified: Secondary | ICD-10-CM | POA: Diagnosis present

## 2019-05-02 DIAGNOSIS — I8511 Secondary esophageal varices with bleeding: Secondary | ICD-10-CM

## 2019-05-02 DIAGNOSIS — K219 Gastro-esophageal reflux disease without esophagitis: Secondary | ICD-10-CM | POA: Diagnosis present

## 2019-05-02 DIAGNOSIS — K2970 Gastritis, unspecified, without bleeding: Secondary | ICD-10-CM | POA: Diagnosis present

## 2019-05-02 DIAGNOSIS — R748 Abnormal levels of other serum enzymes: Secondary | ICD-10-CM | POA: Diagnosis present

## 2019-05-02 DIAGNOSIS — I8501 Esophageal varices with bleeding: Principal | ICD-10-CM | POA: Diagnosis present

## 2019-05-02 DIAGNOSIS — F1010 Alcohol abuse, uncomplicated: Secondary | ICD-10-CM | POA: Diagnosis present

## 2019-05-02 DIAGNOSIS — K449 Diaphragmatic hernia without obstruction or gangrene: Secondary | ICD-10-CM | POA: Diagnosis present

## 2019-05-02 DIAGNOSIS — I10 Essential (primary) hypertension: Secondary | ICD-10-CM | POA: Diagnosis present

## 2019-05-02 DIAGNOSIS — Z20828 Contact with and (suspected) exposure to other viral communicable diseases: Secondary | ICD-10-CM | POA: Diagnosis present

## 2019-05-02 DIAGNOSIS — K920 Hematemesis: Secondary | ICD-10-CM | POA: Diagnosis present

## 2019-05-02 DIAGNOSIS — K766 Portal hypertension: Secondary | ICD-10-CM | POA: Diagnosis present

## 2019-05-02 DIAGNOSIS — K3189 Other diseases of stomach and duodenum: Secondary | ICD-10-CM | POA: Diagnosis present

## 2019-05-02 DIAGNOSIS — R739 Hyperglycemia, unspecified: Secondary | ICD-10-CM | POA: Diagnosis present

## 2019-05-02 DIAGNOSIS — K7460 Unspecified cirrhosis of liver: Secondary | ICD-10-CM | POA: Diagnosis present

## 2019-05-02 DIAGNOSIS — Z87891 Personal history of nicotine dependence: Secondary | ICD-10-CM

## 2019-05-02 HISTORY — PX: ESOPHAGOGASTRODUODENOSCOPY (EGD) WITH PROPOFOL: SHX5813

## 2019-05-02 LAB — RESPIRATORY PANEL BY RT PCR (FLU A&B, COVID)
Influenza A by PCR: NEGATIVE
Influenza B by PCR: NEGATIVE
SARS Coronavirus 2 by RT PCR: NEGATIVE

## 2019-05-02 LAB — CBC WITH DIFFERENTIAL/PLATELET
Abs Immature Granulocytes: 0.03 10*3/uL (ref 0.00–0.07)
Basophils Absolute: 0 10*3/uL (ref 0.0–0.1)
Basophils Relative: 0 %
Eosinophils Absolute: 0 10*3/uL (ref 0.0–0.5)
Eosinophils Relative: 0 %
HCT: 38.8 % — ABNORMAL LOW (ref 39.0–52.0)
Hemoglobin: 12.9 g/dL — ABNORMAL LOW (ref 13.0–17.0)
Immature Granulocytes: 0 %
Lymphocytes Relative: 16 %
Lymphs Abs: 1.5 10*3/uL (ref 0.7–4.0)
MCH: 30.6 pg (ref 26.0–34.0)
MCHC: 33.2 g/dL (ref 30.0–36.0)
MCV: 91.9 fL (ref 80.0–100.0)
Monocytes Absolute: 1.3 10*3/uL — ABNORMAL HIGH (ref 0.1–1.0)
Monocytes Relative: 14 %
Neutro Abs: 6.2 10*3/uL (ref 1.7–7.7)
Neutrophils Relative %: 70 %
Platelets: 220 10*3/uL (ref 150–400)
RBC: 4.22 MIL/uL (ref 4.22–5.81)
RDW: 14.6 % (ref 11.5–15.5)
WBC: 9.1 10*3/uL (ref 4.0–10.5)
nRBC: 0 % (ref 0.0–0.2)

## 2019-05-02 LAB — COMPREHENSIVE METABOLIC PANEL
ALT: 28 U/L (ref 0–44)
AST: 65 U/L — ABNORMAL HIGH (ref 15–41)
Albumin: 3.4 g/dL — ABNORMAL LOW (ref 3.5–5.0)
Alkaline Phosphatase: 106 U/L (ref 38–126)
Anion gap: 13 (ref 5–15)
BUN: 17 mg/dL (ref 6–20)
CO2: 20 mmol/L — ABNORMAL LOW (ref 22–32)
Calcium: 9.9 mg/dL (ref 8.9–10.3)
Chloride: 108 mmol/L (ref 98–111)
Creatinine, Ser: 0.86 mg/dL (ref 0.61–1.24)
GFR calc Af Amer: >60 mL/min (ref >60)
GFR calc non Af Amer: >60 mL/min (ref >60)
Glucose, Bld: 176 mg/dL — ABNORMAL HIGH (ref 70–99)
Potassium: 3.9 mmol/L (ref 3.5–5.1)
Sodium: 141 mmol/L (ref 135–145)
Total Bilirubin: 1.3 mg/dL — ABNORMAL HIGH (ref 0.3–1.2)
Total Protein: 8 g/dL (ref 6.5–8.1)

## 2019-05-02 LAB — URINALYSIS, ROUTINE W REFLEX MICROSCOPIC
Bacteria, UA: NONE SEEN
Glucose, UA: NEGATIVE mg/dL
Hgb urine dipstick: NEGATIVE
Ketones, ur: 20 mg/dL — AB
Leukocytes,Ua: NEGATIVE
Nitrite: NEGATIVE
Protein, ur: 30 mg/dL — AB
Specific Gravity, Urine: 1.032 — ABNORMAL HIGH (ref 1.005–1.030)
pH: 5 (ref 5.0–8.0)

## 2019-05-02 LAB — CBC
HCT: 37.3 % — ABNORMAL LOW (ref 39.0–52.0)
Hemoglobin: 12.4 g/dL — ABNORMAL LOW (ref 13.0–17.0)
MCH: 30.8 pg (ref 26.0–34.0)
MCHC: 33.2 g/dL (ref 30.0–36.0)
MCV: 92.6 fL (ref 80.0–100.0)
Platelets: 209 10*3/uL (ref 150–400)
RBC: 4.03 MIL/uL — ABNORMAL LOW (ref 4.22–5.81)
RDW: 14.8 % (ref 11.5–15.5)
WBC: 11.8 10*3/uL — ABNORMAL HIGH (ref 4.0–10.5)
nRBC: 0 % (ref 0.0–0.2)

## 2019-05-02 LAB — TYPE AND SCREEN
ABO/RH(D): O POS
Antibody Screen: NEGATIVE

## 2019-05-02 LAB — GLUCOSE, CAPILLARY: Glucose-Capillary: 124 mg/dL — ABNORMAL HIGH (ref 70–99)

## 2019-05-02 LAB — LIPASE, BLOOD: Lipase: 47 U/L (ref 11–51)

## 2019-05-02 LAB — PHOSPHORUS: Phosphorus: 3.6 mg/dL (ref 2.5–4.6)

## 2019-05-02 LAB — MAGNESIUM: Magnesium: 1.4 mg/dL — ABNORMAL LOW (ref 1.7–2.4)

## 2019-05-02 SURGERY — ESOPHAGOGASTRODUODENOSCOPY (EGD) WITH PROPOFOL
Anesthesia: General

## 2019-05-02 MED ORDER — LORAZEPAM 2 MG/ML IJ SOLN
0.0000 mg | Freq: Four times a day (QID) | INTRAMUSCULAR | Status: AC
  Administered 2019-05-03: 2 mg via INTRAVENOUS
  Filled 2019-05-02: qty 1

## 2019-05-02 MED ORDER — PROPOFOL 500 MG/50ML IV EMUL
INTRAVENOUS | Status: DC | PRN
  Administered 2019-05-02: 200 ug/kg/min via INTRAVENOUS

## 2019-05-02 MED ORDER — ONDANSETRON HCL 4 MG/2ML IJ SOLN
4.0000 mg | Freq: Once | INTRAMUSCULAR | Status: AC
  Administered 2019-05-02: 4 mg via INTRAVENOUS
  Filled 2019-05-02: qty 2

## 2019-05-02 MED ORDER — METOPROLOL TARTRATE 25 MG PO TABS: 25.0000 mg | ORAL_TABLET | Freq: Two times a day (BID) | ORAL | Status: DC

## 2019-05-02 MED ORDER — VITAMIN B-1 100 MG PO TABS
100.0000 mg | ORAL_TABLET | Freq: Every day | ORAL | Status: DC
  Administered 2019-05-02 – 2019-05-04 (×3): 100 mg via ORAL
  Filled 2019-05-02 (×4): qty 1

## 2019-05-02 MED ORDER — LORAZEPAM 2 MG/ML IJ SOLN: 0.0000 mg | Freq: Two times a day (BID) | INTRAMUSCULAR | Status: DC

## 2019-05-02 MED ORDER — OCTREOTIDE LOAD VIA INFUSION
50.0000 ug | Freq: Once | INTRAVENOUS | Status: AC
  Administered 2019-05-02: 09:00:00 50 ug via INTRAVENOUS
  Filled 2019-05-02: qty 25

## 2019-05-02 MED ORDER — TRAZODONE HCL 50 MG PO TABS
150.0000 mg | ORAL_TABLET | Freq: Every day | ORAL | Status: DC
  Administered 2019-05-02 – 2019-05-04 (×3): 150 mg via ORAL
  Filled 2019-05-02 (×3): qty 1
  Filled 2019-05-02 (×2): qty 3

## 2019-05-02 MED ORDER — LACTATED RINGERS IV SOLN
Freq: Once | INTRAVENOUS | Status: AC
  Administered 2019-05-02: 15:00:00 via INTRAVENOUS

## 2019-05-02 MED ORDER — ACETAMINOPHEN 650 MG RE SUPP: 650.0000 mg | Freq: Four times a day (QID) | RECTAL | Status: DC | PRN

## 2019-05-02 MED ORDER — CIPROFLOXACIN IN D5W 400 MG/200ML IV SOLN
400.0000 mg | Freq: Two times a day (BID) | INTRAVENOUS | Status: DC
  Administered 2019-05-03: 400 mg via INTRAVENOUS
  Filled 2019-05-02: qty 200

## 2019-05-02 MED ORDER — SODIUM CHLORIDE 0.9% FLUSH: 3.0000 mL | INTRAVENOUS | Status: DC | PRN

## 2019-05-02 MED ORDER — PANTOPRAZOLE SODIUM 40 MG IV SOLR
40.0000 mg | Freq: Once | INTRAVENOUS | Status: AC
  Administered 2019-05-02: 40 mg via INTRAVENOUS
  Filled 2019-05-02: qty 40

## 2019-05-02 MED ORDER — OCTREOTIDE ACETATE 500 MCG/ML IJ SOLN
INTRAMUSCULAR | Status: AC
  Filled 2019-05-02: qty 1

## 2019-05-02 MED ORDER — AMLODIPINE BESYLATE 5 MG PO TABS: 5.0000 mg | ORAL_TABLET | Freq: Every day | ORAL | Status: DC

## 2019-05-02 MED ORDER — LACTATED RINGERS IV SOLN
INTRAVENOUS | Status: DC | PRN
  Administered 2019-05-02 (×2): via INTRAVENOUS

## 2019-05-02 MED ORDER — LIDOCAINE HCL (CARDIAC) PF 100 MG/5ML IV SOSY
PREFILLED_SYRINGE | INTRAVENOUS | Status: DC | PRN
  Administered 2019-05-02: 50 mg via INTRATRACHEAL

## 2019-05-02 MED ORDER — LIDOCAINE VISCOUS HCL 2 % MT SOLN
5.0000 mL | Freq: Once | OROMUCOSAL | Status: DC
  Filled 2019-05-02: qty 15

## 2019-05-02 MED ORDER — CIPROFLOXACIN IN D5W 400 MG/200ML IV SOLN
INTRAVENOUS | Status: DC | PRN
  Administered 2019-05-02: 400 mg via INTRAVENOUS

## 2019-05-02 MED ORDER — POLYETHYLENE GLYCOL 3350 17 G PO PACK: 17.0000 g | PACK | Freq: Every day | ORAL | Status: DC | PRN

## 2019-05-02 MED ORDER — ADULT MULTIVITAMIN W/MINERALS CH
1.0000 | ORAL_TABLET | Freq: Every day | ORAL | Status: DC
  Administered 2019-05-02 – 2019-05-03 (×2): 1 via ORAL
  Filled 2019-05-02 (×3): qty 1

## 2019-05-02 MED ORDER — LORAZEPAM 1 MG PO TABS: 1.0000 mg | ORAL_TABLET | ORAL | Status: DC | PRN

## 2019-05-02 MED ORDER — FOLIC ACID 1 MG PO TABS
1.0000 mg | ORAL_TABLET | Freq: Every day | ORAL | Status: DC
  Administered 2019-05-02 – 2019-05-04 (×3): 1 mg via ORAL
  Filled 2019-05-02 (×4): qty 1

## 2019-05-02 MED ORDER — LIDOCAINE VISCOUS HCL 2 % MT SOLN
OROMUCOSAL | Status: DC | PRN
  Administered 2019-05-02: 1 via OROMUCOSAL

## 2019-05-02 MED ORDER — LORAZEPAM 2 MG/ML IJ SOLN: 1.0000 mg | INTRAMUSCULAR | Status: DC | PRN

## 2019-05-02 MED ORDER — PROPOFOL 10 MG/ML IV BOLUS
INTRAVENOUS | Status: AC
  Filled 2019-05-02: qty 20

## 2019-05-02 MED ORDER — GLYCOPYRROLATE 0.2 MG/ML IJ SOLN
INTRAMUSCULAR | Status: DC | PRN
  Administered 2019-05-02: 0.2 mg via INTRAVENOUS

## 2019-05-02 MED ORDER — ACETAMINOPHEN 325 MG PO TABS: 650.0000 mg | ORAL_TABLET | Freq: Four times a day (QID) | ORAL | Status: DC | PRN

## 2019-05-02 MED ORDER — LABETALOL HCL 5 MG/ML IV SOLN: 10.0000 mg | INTRAVENOUS | Status: DC | PRN

## 2019-05-02 MED ORDER — CIPROFLOXACIN IN D5W 400 MG/200ML IV SOLN
INTRAVENOUS | Status: AC
  Filled 2019-05-02: qty 200

## 2019-05-02 MED ORDER — SODIUM CHLORIDE 0.9 % IV SOLN: INTRAVENOUS | Status: DC

## 2019-05-02 MED ORDER — THIAMINE HCL 100 MG/ML IJ SOLN: 100.0000 mg | Freq: Every day | INTRAMUSCULAR | Status: DC

## 2019-05-02 MED ORDER — PROPOFOL 10 MG/ML IV BOLUS
INTRAVENOUS | Status: DC | PRN
  Administered 2019-05-02 (×2): 100 ug via INTRAVENOUS

## 2019-05-02 MED ORDER — ONDANSETRON HCL 4 MG/2ML IJ SOLN: 4.0000 mg | Freq: Four times a day (QID) | INTRAMUSCULAR | Status: DC | PRN

## 2019-05-02 MED ORDER — GLYCOPYRROLATE PF 0.2 MG/ML IJ SOSY
PREFILLED_SYRINGE | INTRAMUSCULAR | Status: AC
  Filled 2019-05-02: qty 1

## 2019-05-02 MED ORDER — TRAZODONE HCL 50 MG PO TABS: 50.0000 mg | ORAL_TABLET | Freq: Every evening | ORAL | Status: DC | PRN

## 2019-05-02 MED ORDER — ONDANSETRON HCL 4 MG/2ML IJ SOLN
INTRAMUSCULAR | Status: DC | PRN
  Administered 2019-05-02: 4 mg via INTRAVENOUS

## 2019-05-02 MED ORDER — METOPROLOL TARTRATE 25 MG PO TABS
25.0000 mg | ORAL_TABLET | Freq: Two times a day (BID) | ORAL | Status: DC
  Administered 2019-05-03: 25 mg via ORAL
  Filled 2019-05-02: qty 1

## 2019-05-02 MED ORDER — SODIUM CHLORIDE 0.9 % IV SOLN
50.0000 ug/h | INTRAVENOUS | Status: DC
  Administered 2019-05-02 – 2019-05-05 (×6): 50 ug/h via INTRAVENOUS
  Filled 2019-05-02 (×16): qty 1

## 2019-05-02 MED ORDER — SODIUM CHLORIDE 0.9 % IV SOLN: 250.0000 mL | INTRAVENOUS | Status: DC | PRN

## 2019-05-02 MED ORDER — PANTOPRAZOLE SODIUM 40 MG PO TBEC
40.0000 mg | DELAYED_RELEASE_TABLET | Freq: Two times a day (BID) | ORAL | Status: DC
  Administered 2019-05-03 – 2019-05-04 (×4): 40 mg via ORAL
  Filled 2019-05-02 (×6): qty 1

## 2019-05-02 MED ORDER — PROPOFOL 10 MG/ML IV BOLUS
INTRAVENOUS | Status: AC
  Filled 2019-05-02: qty 40

## 2019-05-02 MED ORDER — ALBUTEROL SULFATE (2.5 MG/3ML) 0.083% IN NEBU: 2.5000 mg | INHALATION_SOLUTION | RESPIRATORY_TRACT | Status: DC | PRN

## 2019-05-02 MED ORDER — ONDANSETRON HCL 4 MG PO TABS: 4.0000 mg | ORAL_TABLET | Freq: Four times a day (QID) | ORAL | Status: DC | PRN

## 2019-05-02 MED ORDER — ONDANSETRON HCL 4 MG/2ML IJ SOLN
INTRAMUSCULAR | Status: AC
  Filled 2019-05-02: qty 2

## 2019-05-02 MED ORDER — ONDANSETRON HCL 4 MG/2ML IJ SOLN: 4.0000 mg | Freq: Once | INTRAMUSCULAR | Status: DC | PRN

## 2019-05-02 MED ORDER — SODIUM CHLORIDE 0.9% FLUSH
3.0000 mL | Freq: Two times a day (BID) | INTRAVENOUS | Status: DC
  Administered 2019-05-03 – 2019-05-04 (×3): 3 mL via INTRAVENOUS

## 2019-05-02 MED ORDER — LIDOCAINE VISCOUS HCL 2 % MT SOLN
OROMUCOSAL | Status: AC
  Filled 2019-05-02: qty 15

## 2019-05-02 MED ORDER — SODIUM CHLORIDE 0.9 % IV SOLN
INTRAVENOUS | Status: DC
  Administered 2019-05-02 – 2019-05-03 (×3): via INTRAVENOUS

## 2019-05-02 NOTE — ED Triage Notes (Signed)
Pt vomiting dark red blood that started this morning.  Pt reports he drinks alcohol everyday.

## 2019-05-02 NOTE — H&P (Addendum)
Patient Demographics:    Cody [name], is a 48 y.o. male  MRN: [id number]   DOB - 05/12/1971  Admit Date - 05/02/2019  Outpatient Primary MD for the patient is [facility] [place]:    Principal Problem:   Esophageal varices with bleeding [facility]) Active Problems:   GI bleed   Alcohol abuse   Essential hypertension   Hematemesis with nausea   EGD 05/02/2019 Impression:-  - Grade II and grade III esophageal varices with  stigmata of recent bleeding. Completely eradicated. Banded.   - Portal hypertensive gastropathy,- MILD Gastritis. Biopsied    1) acute GI bleed secondary to esophageal varices with bleeding----status post EGD with banding  -Discussed with GI physician Dr. Oneida Alar -Treat with continuous IV octreotide drip for 72 hours through 05/05/2019 -Oral Protonix 40 mg twice daily switched to daily dosing after 1 month -Hemoglobin is currently 12.9, no recent baseline available -Check serial H&H and transfuse as clinically indicated -Given portal hypertension esophageal varices avoid transfusion if hemoglobin above 8 (higher threshold for transfusion) GI soft diet as ordered  2)Alcohol Abuse--- risk for delirium tremens -Lorazepam per CIWA protocol multivitamin as ordered  3)Possible Liver Cirrhosis--- esophageal varices and portal hypertension as noted on EGD acute hepatitis profile pending, abdominal ultrasound pending -Check INR -Platelets are not low -AST is 65 with ALT of 28, T bili is 1.3  4) hyperglycemia--- random glucose was 176, check A1c with a.m. labs  5)HTN--hold home BP meds due to risk for hypotension with GI bleed  With History of - Reviewed by me  Past Medical History:  Diagnosis Date  . Hypertension       Past Surgical History:  Procedure  Laterality Date  . ESOPHAGOGASTRODUODENOSCOPY  02/10/2007   Dr. Gala Romney; distal esophageal erosions consistent with reflux esophagitis, moderate sized hiatal hernia, submucosal gastric hemorrhage.  . TONSILLECTOMY  childhood      Chief Complaint  Patient presents with  . Hematemesis      HPI:    Cody [name]  is a 48 y.o. male former smoker with ongoing alcohol abuse, HTN and GERD presents to the ED with multiple episodes of hematemesis of less than 24 hours duration--initially had dark red blood and then subsequently had coffee-ground -Patient admits to daily alcohol use specifically beer -No chest pains no palpitations no shortness of breath no dizziness no syncope -Denies hematochezia or significant melena -Denies NSAID use  In Ed--chest x-ray without acute findings, hemoglobin 12.9 without recent baseline,  -Lipase is not elevated, AST 65 total bili is 1.3 albumin is 3.4, ALT is not elevated -Elevated glucose noted at 176  EGD 05/02/2019 Impression:-  - Grade II and grade III esophageal varices with  stigmata of recent bleeding. Completely eradicated. Banded.   - Portal hypertensive gastropathy,- MILD Gastritis. Biopsied   Review of systems:    In addition to the HPI above,   A full Review of  Systems was done, all other systems reviewed are negative except as noted above in HPI , .    Social History:  Reviewed by me    Social History   Tobacco Use  . Smoking status: Never Smoker  . Smokeless tobacco: Never Used  Substance Use Topics  . Alcohol use: Yes    Comment: 24 ounce beer and shot of liquor a day        Family History :  Reviewed by me    Family History  Problem Relation Age of Onset  . Colon cancer Neg Hx   . Stomach cancer Neg Hx   . Esophageal cancer Neg Hx     Home Medications:   Prior to Admission medications   Medication Sig Start Date End Date Taking? Authorizing Provider  amLODipine (NORVASC) 5 MG tablet Take 5 mg by mouth daily.  11/21/18  Yes [provider]  metoprolol tartrate (LOPRESSOR) 25 MG tablet Take 25 mg by mouth 2 (two) times daily. 11/21/18  Yes [provider]     Allergies:    No Known Allergies   Physical Exam:   Vitals  Blood pressure (!) 139/95, pulse (!) 102, temperature 98.6 F (37 C), temperature source Oral, resp. rate 18, height 5\' 9"  (1.753 m), weight 81.6 kg, SpO2 99 %.  Physical Examination: General appearance - alert, well appearing, and in no distress  Mental status - alert, oriented to person, place, and time,  Eyes - sclera anicteric Neck - supple, no JVD elevation , Chest - clear  to auscultation bilaterally, symmetrical air movement,  Heart - S1 and S2 normal, regular  Abdomen - soft, , nondistended, mild epigastric discomfort Neurological - screening mental status exam normal, neck supple without rigidity, cranial nerves II through XII intact, DTR's normal and symmetric Extremities - no pedal edema noted, intact peripheral pulses  Skin - warm, dry     Data Review:    CBC Recent Labs  Lab 05/02/19 0815  WBC 9.1  HGB 12.9*  HCT 38.8*  PLT 220  MCV 91.9  MCH 30.6  MCHC 33.2  RDW 14.6  LYMPHSABS 1.5  MONOABS 1.3*  EOSABS 0.0  BASOSABS 0.0   ------------------------------------------------------------------------------------------------------------------  Chemistries  Recent Labs  Lab 05/02/19 0815  NA 141  K 3.9  CL 108  CO2 20*  GLUCOSE 176*  BUN 17  CREATININE 0.86  CALCIUM 9.9  AST 65*  ALT 28  ALKPHOS 106  BILITOT 1.3*   ------------------------------------------------------------------------------------------------------------------ estimated creatinine clearance is 105 mL/min (by C-G formula based on SCr of 0.86 mg/dL). ------------------------------------------------------------------------------------------------------------------ No results for input(s): TSH, T4TOTAL, T3FREE, THYROIDAB in the last 72 hours.  Invalid  input(s): FREET3   Coagulation profile No results for input(s): INR, PROTIME in the last 168 hours. ------------------------------------------------------------------------------------------------------------------- No results for input(s): DDIMER in the last 72 hours. -------------------------------------------------------------------------------------------------------------------  Cardiac Enzymes No results for input(s): CKMB, TROPONINI, MYOGLOBIN in the last 168 hours.  Invalid input(s): CK ------------------------------------------------------------------------------------------------------------------ No results found for: BNP   ---------------------------------------------------------------------------------------------------------------  Urinalysis    Component Value Date/Time   COLORURINE AMBER (A) 05/02/2019 0810   APPEARANCEUR CLEAR 05/02/2019 0810   LABSPEC 1.032 (H) 05/02/2019 0810   PHURINE 5.0 05/02/2019 0810   GLUCOSEU NEGATIVE 05/02/2019 0810   HGBUR NEGATIVE 05/02/2019 0810   BILIRUBINUR SMALL (A) 05/02/2019 0810   KETONESUR 20 (A) 05/02/2019 0810   PROTEINUR 30 (A) 05/02/2019 0810   NITRITE NEGATIVE 05/02/2019 0810   LEUKOCYTESUR NEGATIVE 05/02/2019 0810    ----------------------------------------------------------------------------------------------------------------  Imaging Results:    Dg Chest Portable 1 View  Result Date: 05/02/2019 CLINICAL DATA:  Pt c/o hematemesis this AM. Hx HTN, nonsmoker emesis EXAM: PORTABLE CHEST 1 VIEW COMPARISON:  10/14/2007 FINDINGS: The heart size and mediastinal contours are within normal limits. Both lungs are clear. The visualized skeletal structures are unremarkable. Appearance of the UPPER abdomen is unremarkable. IMPRESSION: Negative exam. Electronically Signed   By: Norva Pavlov M.D.   On: 05/02/2019 08:28    Radiological Exams on Admission: Dg Chest Portable 1 View  Result Date: 05/02/2019 CLINICAL DATA:   Pt c/o hematemesis this AM. Hx HTN, nonsmoker emesis EXAM: PORTABLE CHEST 1 VIEW COMPARISON:  10/14/2007 FINDINGS: The heart size and mediastinal contours are within normal limits. Both lungs are clear. The visualized skeletal structures are unremarkable. Appearance of the UPPER abdomen is unremarkable. IMPRESSION: Negative exam. Electronically Signed   By: Norva Pavlov M.D.   On: 05/02/2019 08:28    DVT Prophylaxis -SCD (GI bleed) AM Labs Ordered, also please review Full Orders  Family Communication: Admission, patients condition and plan of care including tests being ordered have been discussed with the patient who indicate understanding and agree with the plan   Code Status - Full Code  Likely DC to  Home after completing 72 hours of octreotide infusion  Condition   Stable   Shon Hale M.D on 05/02/2019 at 7:47 PM Go to [email or web address] -  for contact info  [facility] - Office  [phone]

## 2019-05-02 NOTE — Anesthesia Postprocedure Evaluation (Signed)
Anesthesia Post Note  Patient: Cody [name]  Procedure(s) Performed: ESOPHAGOGASTRODUODENOSCOPY (EGD) WITH PROPOFOL (N/A )  Patient location during evaluation: PACU Anesthesia Type: General Level of consciousness: awake and alert and oriented Pain management: pain level controlled Vital Signs Assessment: post-procedure vital signs reviewed and stable Respiratory status: spontaneous breathing Cardiovascular status: blood pressure returned to baseline and stable Postop Assessment: no apparent nausea or vomiting Anesthetic complications: no     Last Vitals:  Vitals:   05/02/19 1427 05/02/19 1613  BP: 129/86   Pulse: 92   Resp: 20 15  Temp:  37.1 C  SpO2: 95%     Last Pain:  Vitals:   05/02/19 1411  TempSrc: Oral  PainSc: 0-No pain                 Therin Vetsch

## 2019-05-02 NOTE — ED Notes (Signed)
Pt vomiting moderate amount of dark red blood with clots present.

## 2019-05-02 NOTE — Transfer of Care (Signed)
Immediate Anesthesia Transfer of Care Note  Patient: STACI CARVER  Procedure(s) Performed: ESOPHAGOGASTRODUODENOSCOPY (EGD) WITH PROPOFOL (N/A )  Patient Location: PACU  Anesthesia Type:General  Level of Consciousness: awake, alert  and oriented  Airway & Oxygen Therapy: Patient Spontanous Breathing  Post-op Assessment: Report given to RN, Post -op Vital signs reviewed and stable and Patient moving all extremities X 4  Post vital signs: Reviewed and stable  Last Vitals:  Vitals Value Taken Time  BP    Temp    Pulse 97 05/02/19 1612  Resp    SpO2 98 % 05/02/19 1612  Vitals shown include unvalidated device data.  Last Pain:  Vitals:   05/02/19 1411  TempSrc: Oral  PainSc: 0-No pain      Patients Stated Pain Goal: 7 (53/64/68 0321)  Complications: No apparent anesthesia complications

## 2019-05-02 NOTE — Anesthesia Preprocedure Evaluation (Addendum)
Anesthesia Evaluation  Patient identified by MRN, date of birth, ID band Patient awake    Reviewed: Allergy & Precautions, NPO status , Patient's Chart, lab work & pertinent test results, reviewed documented beta blocker date and time   Airway Mallampati: II  TM Distance: >3 FB Neck ROM: Full    Dental  (+) Loose, Dental Advisory Given,    Pulmonary neg pulmonary ROS,    breath sounds clear to auscultation       Cardiovascular Exercise Tolerance: Good hypertension, Pt. on medications and Pt. on home beta blockers Normal cardiovascular exam Rhythm:Regular Rate:Normal     Neuro/Psych PSYCHIATRIC DISORDERS Anxiety    GI/Hepatic GERD  ,(+)     substance abuse  alcohol use, Multiple episodes of hematemesis     Endo/Other  negative endocrine ROS  Renal/GU negative Renal ROS     Musculoskeletal   Abdominal   Peds  Hematology   Anesthesia Other Findings   Reproductive/Obstetrics                           Anesthesia Physical Anesthesia Plan  ASA: III  Anesthesia Plan: General   Post-op Pain Management:    Induction: Intravenous  PONV Risk Score and Plan: 0  Airway Management Planned: Natural Airway, Nasal Cannula and Mask  Additional Equipment:   Intra-op Plan:   Post-operative Plan: Possible Post-op intubation/ventilation  Informed Consent: I have reviewed the patients History and Physical, chart, labs and discussed the procedure including the risks, benefits and alternatives for the proposed anesthesia with the patient or authorized representative who has indicated his/her understanding and acceptance.     Dental advisory given  Plan Discussed with: CRNA  Anesthesia Plan Comments:       Anesthesia Quick Evaluation

## 2019-05-02 NOTE — H&P (View-Only) (Signed)
Referring Provider: Nuala Alpha, PA-C Primary Care Physician:  [facility], [facility] Primary Gastroenterologist:  Dr. Gala Romney  Date of Admission: 05/02/19 Date of Consultation: 05/02/19  Reason for Consultation:  Hematemesis  HPI:  Cody [name] is a 48 y.o. year old male with past medical history of hypertension and daily alcohol use.  Patient presented to the emergency department earlier this morning due to hematemesis.   ED course: Patient has remained hemodynamically stable.  WBC 9.1, hemoglobin 12.9 (L), hematocrit 38.8 (L), platelets 220.  Electrolytes within normal limits.  Kidney function normal.  AST 65 (H), ALT 28, alk phos 106, total bilirubin 1.3 (H).  Lipase normal at 47.  Patient received IV Protonix 40 mg, Zofran 4 mg, and was started on octreotide infusion.  GI was consulted.  Prior GI evaluation:  EGD 02/10/2007 for nausea, vomiting, and small-volume hematemesis.  Impression with multiple distal esophageal erosions consistent with erosive reflux esophagitis, otherwise normal esophagus/patulous EG junction.  Also with moderate size hiatal hernia, submucosal gastric hemorrhage, otherwise normal stomach, patent pylorus normal, normal D1/D2.  It was suspected patient had trivial upper GI bleed likely secondary to Mallory-Weiss tear equivalent with some trauma to the proximal gastric mucosa related to heaving/vomiting.  Recommendations to continue PPI indefinitely and stop alcohol use.  Today:  Woke up around 3:30am. Felt nauseated. Threw up dark red blood. Reports blood was dark red to black. Feels it was a lot of blood. Reports 5-6 episodes of emesis. Last episode of emesis about 1 hour ago. States it has calmed down a lot since arriving to the ED. This has never happened before. Continues with nausea. Usually drinks 24 oz budweiser and shot of vodka daily. States he doesn't get drunk. Was in his normal state of health yesterday. Had minimal nausea yesterday. Drank  glass of milk and nausea resolved. Denies any GERD symptoms. No dysphagia. No unintentional weight loss. Denies NSAIDs. No abdominal pain. No blood in the stool. No melena. BMs daily. BM this morning was normal in color. Denies sick contacts. No diarrhea.  Reports mom, dad, and one of his sisters are going to get tested for Covid as his other sister that does not live in his household recently tested positive.  He was not in contact with the sister who tested positive.  He does however live at home with his parents.  Only taking amlodipine and metoprolol. Has not been taking PPI.   Had a little dizziness this morning but this resolved. No fever or chills. No chest pain or heart palpitations. No shortness of breath or cough.  Denies swelling in his abdomen or lower extremities, yellowing of his skin, confusion, easy bruising, or dark urine.  Last meal around 8 or 9 last night.  Drank about 8 ounces of apple juice this morning around 5 AM.  Past Medical History:  Diagnosis Date  . Hypertension     Past Surgical History:  Procedure Laterality Date  . TONSILLECTOMY  childhood    Prior to Admission medications   Medication Sig Start Date End Date Taking? Authorizing Provider  lisinopril-hydrochlorothiazide (PRINZIDE,ZESTORETIC) 20-25 MG per tablet Take 1 tablet by mouth daily. 10/09/11   Alycia Rossetti, MD    Current Facility-Administered Medications  Medication Dose Route Frequency Provider Last Rate Last Dose  . octreotide (SANDOSTATIN) 500 mcg in sodium chloride 0.9 % 250 mL (2 mcg/mL) infusion  50 mcg/hr Intravenous Continuous Nuala Alpha A, PA-C 25 mL/hr at 05/02/19 0929 50 mcg/hr at 05/02/19 [id number]  Current Outpatient Medications  Medication Sig Dispense Refill  . lisinopril-hydrochlorothiazide (PRINZIDE,ZESTORETIC) 20-25 MG per tablet Take 1 tablet by mouth daily. 30 tablet 3    Allergies as of 05/02/2019  . (No Known Allergies)    No family history on file.  Social  History   Socioeconomic History  . Marital status: Single    Spouse name: Not on file  . Number of children: Not on file  . Years of education: Not on file  . Highest education level: Not on file  Occupational History  . Not on file  Social Needs  . Financial resource strain: Not on file  . Food insecurity    Worry: Not on file    Inability: Not on file  . Transportation needs    Medical: Not on file    Non-medical: Not on file  Tobacco Use  . Smoking status: Never Smoker  . Smokeless tobacco: Never Used  Substance and Sexual Activity  . Alcohol use: Yes    Comment: 24 ounce beer and shot of liquor a day   . Drug use: No  . Sexual activity: Not on file  Lifestyle  . Physical activity    Days per week: Not on file    Minutes per session: Not on file  . Stress: Not on file  Relationships  . Social Herbalist on phone: Not on file    Gets together: Not on file    Attends religious service: Not on file    Active member of club or organization: Not on file    Attends meetings of clubs or organizations: Not on file    Relationship status: Not on file  . Intimate partner violence    Fear of current or ex partner: Not on file    Emotionally abused: Not on file    Physically abused: Not on file    Forced sexual activity: Not on file  Other Topics Concern  . Not on file  Social History Narrative  . Not on file    Review of Systems: Gen: See HPI CV: Denies chest pain, heart palpitations Resp: Denies shortness of breath or cough GI: See HPI  GU : Denies urinary burning, urinary frequency, urinary incontinence or hematuria.  MS: Denies joint pain Derm: Denies rash Psych: Denies depression, anxiety Heme: See HPI  Physical Exam: Vital signs in last 24 hours: Temp:  [99.3 F (37.4 C)] 99.3 F (37.4 C) (12/08 0800) Pulse Rate:  [91-101] 91 (12/08 0930) Resp:  [15-19] 15 (12/08 0930) BP: (117-128)/(82-90) 117/82 (12/08 0930) SpO2:  [94 %-96 %] 96 % (12/08  0930) Weight:  [81.6 kg] 81.6 kg (12/08 0757)   General:   Alert,  Well-developed, well-nourished, pleasant and cooperative in NAD Head:  Normocephalic and atraumatic. Eyes:  Sclera clear, no icterus.   Conjunctiva pink. Ears:  Normal auditory acuity. Lungs:  Clear throughout to auscultation.   No wheezes, crackles, or rhonchi. No acute distress. Heart:  Regular rate and rhythm; no murmurs, clicks, rubs,  or gallops. Abdomen:  Soft and nondistended.  Minimal tenderness to deep palpation in the epigastric area. No masses, hepatosplenomegaly or hernias noted. Normal bowel sounds, without guarding, and without rebound.   Rectal:  Deferred.   Msk:  Symmetrical without gross deformities. Normal posture. Extremities:  Without  edema. Neurologic:  Alert and  oriented x4;  grossly normal neurologically. Skin:  Intact without significant lesions or rashes. Psych:  Normal mood and affect.  Intake/Output from previous  day: No intake/output data recorded. Intake/Output this shift: No intake/output data recorded.  Lab Results: Recent Labs    05/02/19 0815  WBC 9.1  HGB 12.9*  HCT 38.8*  PLT 220   BMET Recent Labs    05/02/19 0815  NA 141  K 3.9  CL 108  CO2 20*  GLUCOSE 176*  BUN 17  CREATININE 0.86  CALCIUM 9.9   LFT Recent Labs    05/02/19 0815  PROT 8.0  ALBUMIN 3.4*  AST 65*  ALT 28  ALKPHOS 106  BILITOT 1.3*   Studies/Results: Dg Chest Portable 1 View  Result Date: 05/02/2019 CLINICAL DATA:  Pt c/o hematemesis this AM. Hx HTN, nonsmoker emesis EXAM: PORTABLE CHEST 1 VIEW COMPARISON:  10/14/2007 FINDINGS: The heart size and mediastinal contours are within normal limits. Both lungs are clear. The visualized skeletal structures are unremarkable. Appearance of the UPPER abdomen is unremarkable. IMPRESSION: Negative exam. Electronically Signed   By: Nolon Nations M.D.   On: 05/02/2019 08:28    Impression: 48 year old male with a history of hypertension and daily  alcohol use who presented to the emergency department the morning of 05/02/2019 for hematemesis.  Patient was in his normal state of health on 05/01/2019 other than mild nausea that resolved after drinking milk.  Woke up around 3 AM on 12/8 with nausea and hematemesis.  Reports 5-6 episodes of hematemesis this morning, none in the last couple of hours.  Denies abdominal pain, GERD symptoms, dysphagia, bright red blood per rectum, melena, unintentional weight loss, or NSAID use.   He has remained hemodynamically stable in the ED.  Hemoglobin slightly low at 12.9.  Last hemoglobin in our system 14.8 in 2009.  He has received IV Protonix 40 mg, Zofran, and was started on octreotide.  Last meal around 8-9 PM yesterday.  Drank about 8 ounces of apple juice around 5 AM this morning.  EGD in 2008 as per above. It was suspected patient had trivial upper GI bleed likely secondary to Mallory-Weiss tear equivalent with some trauma to the proximal gastric mucosa related to heaving/vomiting.  Recommendations to continue PPI indefinitely. He is not on a PPI.   Differentials include alcohol induced gastritis, esophagitis, duodenitis, peptic ulcer disease, H. pylori, Mallory-Weiss tear, and less likely malignancy.  We will plan for EGD with propofol with Dr. Oneida Alar today pending his COVID-19 test results. The risks, benefits, and alternatives have been discussed in detail with patient. They have stated understanding and desire to proceed.   Elevated AST: AST elevated at 65 and total bilirubin slightly elevated at 1.3.  ALT and alk phos normal.  In 2009, AST and ALT slightly elevated at 52 and 61 respectively. He does drink alcohol daily which I suspect is likely the cause of his mild ALT elevation.  No overt signs or symptoms of advanced liver disease.  Platelets normal at 220.  No recent imaging on file.  This can be followed up outpatient.  Plan: Plan for EGD with propofol with Dr. Oneida Alar today pending COVID-19 test results.  The risks, benefits, and alternatives have been discussed in detail with patient. They have stated understanding and desire to proceed.   Further recommendations to follow EGD.  Remain n.p.o. for now. Continue to monitor for ongoing hematemesis. Monitor H/H and transfuse as necessary. If EGD is not completed today, order will need to be placed for IV Protonix 40 mg twice daily.   LOS: 0 days    05/02/2019, 9:33 AM   Cody [name]  [facility]

## 2019-05-02 NOTE — Op Note (Addendum)
[facility] Patient Name: Cody [name] Procedure Date: 05/02/2019 3:12 PM MRN: [id number] Date of Birth: Aug 27, 1970 Attending MD: Jonette Eva MD, MD CSN: [id number] Age: 48 Admit Type: Inpatient Procedure:                Upper GI endoscopy WITH VARICEAL LIGATION Indications:              Hematemesis Providers:                Jonette Eva MD, MD, Jannett Celestine, RN, Burke Keels, Technician Referring MD:             Jacquelin Hawking PA-C, PA Medicines:                Propofol per Anesthesia Complications:            No immediate complications. Estimated Blood Loss:     Estimated blood loss was minimal. Procedure:                Pre-Anesthesia Assessment:                           - Prior to the procedure, a History and Physical                            was performed, and patient medications and                            allergies were reviewed. The patient's tolerance of                            previous anesthesia was also reviewed. The risks                            and benefits of the procedure and the sedation                            options and risks were discussed with the patient.                            All questions were answered, and informed consent                            was obtained. Prior Anticoagulants: The patient has                            taken no previous anticoagulant or antiplatelet                            agents. ASA Grade Assessment: II - A patient with                            mild systemic disease. After reviewing the risks  and benefits, the patient was deemed in                            satisfactory condition to undergo the procedure.                            After obtaining informed consent, the endoscope was                            passed under direct vision. Throughout the                            procedure, the patient's blood pressure, pulse, and               oxygen saturations were monitored continuously. The                            [id number]-[id number] ([id number]) scope was introduced through the                            mouth, and advanced to the duodenal bulb. The upper                            GI endoscopy was accomplished without difficulty.                            The patient tolerated the procedure well. Scope In: 3:42:02 PM Scope Out: 4:02:06 PM Total Procedure Duration: 0 hours 20 minutes 4 seconds  Findings:      Four columns of grade II(1:COLUMN), grade III varices(3: COLUMNS) with       stigmata of recent bleeding were found in the lower third of the       esophagus,. No red wale signs were present. Three bands were       successfully placed with complete eradication, resulting in deflation of       varices. There was no bleeding at the end of the procedure.      Mild portal hypertensive gastropathy was found in the cardia and in the       gastric fundus.      Localized mild inflammation characterized by congestion (edema),       erosions and erythema was found in the gastric antrum.       Biopsies(2:BODY,1:INCISURA, 2:ANTRUM) were taken with a cold forceps for       Helicobacter pylori testing.      The examined duodenum was normal. Impression:               - Grade II and grade III esophageal varices with                            stigmata of recent bleeding. Completely eradicated.                            Banded.                           - Portal hypertensive gastropathy.                           -  MILD Gastritis. Biopsied. Moderate Sedation:      Per Anesthesia Care Recommendation:           - Return patient to hospital ward for ongoing care.                           - Soft diet and low fat diet.                           - Continue present medications. OCTREOTIDE gtt for                            72 hrs. PROTONIX BID FOR ONE MONTH THENONE DAILY.                            CIPRO 500 MG BID FOR 7 DAYS.                            - Await pathology results. VIRAL SEROLOGIES TODAY.                            COMPLETE US ON DEC 9.                           - Repeat upper endoscopy in 2 weeks for endoscopic                            band ligation.                           - Return to GI office in 6 months. Procedure Code(s):        --- Professional ---                           [phone] [id number], Esophagogastroduodenoscopy, flexible,                            transoral; with band ligation of esophageal/gastric                            varices                           43239, Esophagogastroduodenoscopy, flexible,                            transoral; with biopsy, single or multiple Diagnosis Code(s):        --- Professional ---                           I85.01, Esophageal varices with bleeding                           K76.6, Portal hypertension                           K31.89, Other diseases of stomach  and duodenum                           K29.70, Gastritis, unspecified, without bleeding                           K92.0, Hematemesis CPT copyright 2019 [employer]. All rights reserved. The codes documented in this report are preliminary and upon coder review may  be revised to meet current compliance requirements. Barney Drain, MD Barney Drain MD, MD 05/02/2019 4:22:28 PM This report has been signed electronically. Number of Addenda: 0

## 2019-05-02 NOTE — Interval H&P Note (Signed)
History and Physical Interval Note:  05/02/2019 3:26 PM  Cody [name]  has presented today for surgery, with the diagnosis of Hematemesis; Anemia.  The various methods of treatment have been discussed with the patient and family. After consideration of risks, benefits and other options for treatment, the patient has consented to  Procedure(s): ESOPHAGOGASTRODUODENOSCOPY (EGD) WITH PROPOFOL (N/A) as a surgical intervention.  The patient's history has been reviewed, patient examined, no change in status, stable for surgery.  I have reviewed the patient's chart and labs.  Questions were answered to the patient's satisfaction.     [employer]

## 2019-05-02 NOTE — ED Provider Notes (Signed)
[facility] EMERGENCY DEPARTMENT Provider Note   CSN: [id number] Arrival date & time: 05/02/19  0746     History   Chief Complaint Chief Complaint  Patient presents with  . Hematemesis    HPI Cody [name] is a 48 y.o. male history of GERD, hypertension, daily alcohol use presents today for hematemesis.  Patient reports that he was feeling well yesterday afternoon but developed some nausea last night.  When he woke up this morning just prior to arrival his nausea worsened and he had multiple episodes of dark bloody emesis.  Patient reports that nausea continues he had 1 episode of dark bloody emesis here in the ED.  Patient denies similar in the past.  He reports last alcohol intake was last night, endorses "a few beers" every day.  Denies fever/chills, headache, neck pain, chest pain/shortness of breath, abdominal pain, diarrhea, dysuria/hematuria, fall/injury or any additional concerns.     HPI  Past Medical History:  Diagnosis Date  . Hypertension     Patient Active Problem List   Diagnosis Date Noted  . GI bleed 05/02/2019  . Hematemesis with nausea   . GERD 07/20/2008  . GENERALIZED ANXIETY DISORDER 11/03/2007  . ALCOHOL USE 11/03/2007  . HYPERTENSION 11/03/2007    Past Surgical History:  Procedure Laterality Date  . ESOPHAGOGASTRODUODENOSCOPY  02/10/2007   Dr. Jena Gauss; distal esophageal erosions consistent with reflux esophagitis, moderate sized hiatal hernia, submucosal gastric hemorrhage.  . TONSILLECTOMY  childhood        Home Medications    Prior to Admission medications   Medication Sig Start Date End Date Taking? Authorizing Provider  amLODipine (NORVASC) 5 MG tablet Take 5 mg by mouth daily. 11/21/18   [provider]  lisinopril-hydrochlorothiazide (PRINZIDE,ZESTORETIC) 20-25 MG per tablet Take 1 tablet by mouth daily. 10/09/11   Salley Scarlet, MD  metoprolol tartrate (LOPRESSOR) 25 MG tablet Take 25 mg by mouth 2 (two) times daily.  11/21/18   [provider]    Family History Family History  Problem Relation Age of Onset  . Colon cancer Neg Hx   . Stomach cancer Neg Hx   . Esophageal cancer Neg Hx     Social History Social History   Tobacco Use  . Smoking status: Never Smoker  . Smokeless tobacco: Never Used  Substance Use Topics  . Alcohol use: Yes    Comment: 24 ounce beer and shot of liquor a day   . Drug use: No     Allergies   Patient has no known allergies.   Review of Systems Review of Systems Ten systems are reviewed and are negative for acute change except as noted in the HPI   Physical Exam Updated Vital Signs BP 116/86   Pulse 79   Temp 99.3 F (37.4 C) (Oral)   Resp 15   Ht 5\' 9"  (1.753 m)   Wt 81.6 kg   SpO2 93%   BMI 26.58 kg/m   Physical Exam Constitutional:      General: He is not in acute distress.    Appearance: Normal appearance. He is well-developed. He is not ill-appearing or diaphoretic.     Comments: Estimated 100 cc dark bloody emesis in bucket  HENT:     Head: Normocephalic and atraumatic.     Right Ear: External ear normal.     Left Ear: External ear normal.     Nose: Nose normal.  Eyes:     General: Vision grossly intact. Gaze aligned appropriately.  Pupils: Pupils are equal, round, and reactive to light.  Neck:     Musculoskeletal: Normal range of motion.     Trachea: Trachea and phonation normal. No tracheal deviation.  Cardiovascular:     Rate and Rhythm: Normal rate and regular rhythm.     Pulses: Normal pulses.     Heart sounds: Normal heart sounds.  Pulmonary:     Effort: Pulmonary effort is normal. No respiratory distress.     Breath sounds: Normal breath sounds.  Abdominal:     General: There is no distension.     Palpations: Abdomen is soft.     Tenderness: There is no abdominal tenderness. There is no guarding or rebound.  Musculoskeletal: Normal range of motion.  Skin:    General: Skin is warm and dry.  Neurological:      Mental Status: He is alert.     GCS: GCS eye subscore is 4. GCS verbal subscore is 5. GCS motor subscore is 6.     Comments: Speech is clear and goal oriented, follows commands Major Cranial nerves without deficit, no facial droop Moves extremities without ataxia, coordination intact  Psychiatric:        Behavior: Behavior normal.      ED Treatments / Results  Labs (all labs ordered are listed, but only abnormal results are displayed) Labs Reviewed  CBC WITH DIFFERENTIAL/PLATELET - Abnormal; Notable for the following components:      Result Value   Hemoglobin 12.9 (*)    HCT 38.8 (*)    Monocytes Absolute 1.3 (*)    All other components within normal limits  COMPREHENSIVE METABOLIC PANEL - Abnormal; Notable for the following components:   CO2 20 (*)    Glucose, Bld 176 (*)    Albumin 3.4 (*)    AST 65 (*)    Total Bilirubin 1.3 (*)    All other components within normal limits  URINALYSIS, ROUTINE W REFLEX MICROSCOPIC - Abnormal; Notable for the following components:   Color, Urine AMBER (*)    Specific Gravity, Urine 1.032 (*)    Bilirubin Urine SMALL (*)    Ketones, ur 20 (*)    Protein, ur 30 (*)    All other components within normal limits  RESPIRATORY PANEL BY RT PCR (FLU A&B, COVID)  LIPASE, BLOOD  TYPE AND SCREEN    EKG EKG Interpretation  Date/Time:  Tuesday May 02 2019 08:05:21 EST Ventricular Rate:  96 PR Interval:    QRS Duration: 85 QT Interval:  348 QTC Calculation: 440 R Axis:   59 Text Interpretation: Sinus rhythm Biatrial enlargement Borderline T wave abnormalities Baseline wander in lead(s) I III aVL aVF Confirmed by Donnetta Hutchingook, Brian ([id number]) on 05/02/2019 8:39:37 AM   Radiology Dg Chest Portable 1 View  Result Date: 05/02/2019 CLINICAL DATA:  Pt c/o hematemesis this AM. Hx HTN, nonsmoker emesis EXAM: PORTABLE CHEST 1 VIEW COMPARISON:  10/14/2007 FINDINGS: The heart size and mediastinal contours are within normal limits. Both lungs are clear. The  visualized skeletal structures are unremarkable. Appearance of the UPPER abdomen is unremarkable. IMPRESSION: Negative exam. Electronically Signed   By: Norva PavlovElizabeth  Brown M.D.   On: 05/02/2019 08:28    Procedures Procedures (including critical care time)  Medications Ordered in ED Medications  octreotide (SANDOSTATIN) 2 mcg/mL load via infusion 50 mcg (50 mcg Intravenous Bolus from Bag 05/02/19 0928)    And  octreotide (SANDOSTATIN) 500 mcg in sodium chloride 0.9 % 250 mL (2 mcg/mL) infusion (50 mcg/hr Intravenous  New Bag/Given 05/02/19 0929)  labetalol (NORMODYNE) injection 10 mg (has no administration in time range)  pantoprazole (PROTONIX) injection 40 mg (40 mg Intravenous Given 05/02/19 0822)  ondansetron (ZOFRAN) injection 4 mg (4 mg Intravenous Given 05/02/19 0820)     Initial Impression / Assessment and Plan / ED Course  I have reviewed the triage vital signs and the nursing notes.  Pertinent labs & imaging results that were available during my care of the patient were reviewed by me and considered in my medical decision making (see chart for details).  Clinical Course as of May 01 1134  Tue May 02, 2019  0934 Dr. Oneida Alar   [BM]    Clinical Course User Index [BM] Deliah Boston, PA-C    48 year old male history of daily EtOH use, hypertension, GERD presents today with 1 day of nausea and bloody emesis starting this morning.  Moderate amount of dark bloody emesis present on initial evaluation.  He was given Zofran and Protonix.  Concern for possible esophageal varices as etiology of bleeding is alcoholic patient, octreotide ordered.  Type and screen ordered. - CBC with hemoglobin 12.9, nonacute CMP CO2 20, glucose 176, AST 65, bilirubin 1.3, albumin 3.4.  BUN within normal limits.  CMP nonacute Lipase within normal limits Type and screen O+, antibody negative CXR:  IMPRESSION:  Negative exam.   EKG: Sinus rhythm Biatrial enlargement Borderline T wave abnormalities  Baseline wander in lead(s) I III aVL aVF Confirmed by Nat Christen [phone]) on 05/02/2019 8:39:37 AM - Discussed case with Dr. Oneida Alar from gastroenterology who is asked for rapid Covid swab and admission to hospitalist.  Consult called to hospitalist. - Patient reassessed resting comfortably no acute distress vital signs stable.  He reports he is feeling improved he has no complaints at this time, he states understanding of need for admission and is agreeable. - Rapid Covid negative Case discussed between Dr. Denton Brick and Dr. Lacinda Axon. - Patient has been admitted to hospitalist service for further evaluation management.  Patient seen and evaluated by Dr. Lacinda Axon during this visit.  Note: Portions of this report may have been transcribed using voice recognition software. Every effort was made to ensure accuracy; however, inadvertent computerized transcription errors may still be present. Final Clinical Impressions(s) / ED Diagnoses   Final diagnoses:  Hematemesis with nausea    ED Discharge Orders    None       Gari Crown 05/02/19 1135    Nat Christen, MD 05/05/19 [phone]

## 2019-05-02 NOTE — Consult Note (Signed)
Referring Provider: Nuala Alpha, PA-C Primary Care Physician:  [facility] Primary Gastroenterologist:  Dr. Gala Romney  Date of Admission: 05/02/19 Date of Consultation: 05/02/19  Reason for Consultation:  Hematemesis  HPI:  Cody [name] is a 48 y.o. year old male with past medical history of hypertension and daily alcohol use.  Patient presented to the emergency department earlier this morning due to hematemesis.   ED course: Patient has remained hemodynamically stable.  WBC 9.1, hemoglobin 12.9 (L), hematocrit 38.8 (L), platelets 220.  Electrolytes within normal limits.  Kidney function normal.  AST 65 (H), ALT 28, alk phos 106, total bilirubin 1.3 (H).  Lipase normal at 47.  Patient received IV Protonix 40 mg, Zofran 4 mg, and was started on octreotide infusion.  GI was consulted.  Prior GI evaluation:  EGD 02/10/2007 for nausea, vomiting, and small-volume hematemesis.  Impression with multiple distal esophageal erosions consistent with erosive reflux esophagitis, otherwise normal esophagus/patulous EG junction.  Also with moderate size hiatal hernia, submucosal gastric hemorrhage, otherwise normal stomach, patent pylorus normal, normal D1/D2.  It was suspected patient had trivial upper GI bleed likely secondary to Mallory-Weiss tear equivalent with some trauma to the proximal gastric mucosa related to heaving/vomiting.  Recommendations to continue PPI indefinitely and stop alcohol use.  Today:  Woke up around 3:30am. Felt nauseated. Threw up dark red blood. Reports blood was dark red to black. Feels it was a lot of blood. Reports 5-6 episodes of emesis. Last episode of emesis about 1 hour ago. States it has calmed down a lot since arriving to the ED. This has never happened before. Continues with nausea. Usually drinks 24 oz budweiser and shot of vodka daily. States he doesn't get drunk. Was in his normal state of health yesterday. Had minimal nausea yesterday. Drank  glass of milk and nausea resolved. Denies any GERD symptoms. No dysphagia. No unintentional weight loss. Denies NSAIDs. No abdominal pain. No blood in the stool. No melena. BMs daily. BM this morning was normal in color. Denies sick contacts. No diarrhea.  Reports mom, dad, and one of his sisters are going to get tested for Covid as his other sister that does not live in his household recently tested positive.  He was not in contact with the sister who tested positive.  He does however live at home with his parents.  Only taking amlodipine and metoprolol. Has not been taking PPI.   Had a little dizziness this morning but this resolved. No fever or chills. No chest pain or heart palpitations. No shortness of breath or cough.  Denies swelling in his abdomen or lower extremities, yellowing of his skin, confusion, easy bruising, or dark urine.  Last meal around 8 or 9 last night.  Drank about 8 ounces of apple juice this morning around 5 AM.  Past Medical History:  Diagnosis Date  . Hypertension     Past Surgical History:  Procedure Laterality Date  . TONSILLECTOMY  childhood    Prior to Admission medications   Medication Sig Start Date End Date Taking? Authorizing Provider  lisinopril-hydrochlorothiazide (PRINZIDE,ZESTORETIC) 20-25 MG per tablet Take 1 tablet by mouth daily. 10/09/11   Alycia Rossetti, MD    Current Facility-Administered Medications  Medication Dose Route Frequency Provider Last Rate Last Dose  . octreotide (SANDOSTATIN) 500 mcg in sodium chloride 0.9 % 250 mL (2 mcg/mL) infusion  50 mcg/hr Intravenous Continuous Nuala Alpha A, PA-C 25 mL/hr at 05/02/19 0929 50 mcg/hr at 05/02/19 [phone]  Current Outpatient Medications  Medication Sig Dispense Refill  . lisinopril-hydrochlorothiazide (PRINZIDE,ZESTORETIC) 20-25 MG per tablet Take 1 tablet by mouth daily. 30 tablet 3    Allergies as of 05/02/2019  . (No Known Allergies)    No family history on file.  Social  History   Socioeconomic History  . Marital status: Single    Spouse name: Not on file  . Number of children: Not on file  . Years of education: Not on file  . Highest education level: Not on file  Occupational History  . Not on file  Social Needs  . Financial resource strain: Not on file  . Food insecurity    Worry: Not on file    Inability: Not on file  . Transportation needs    Medical: Not on file    Non-medical: Not on file  Tobacco Use  . Smoking status: Never Smoker  . Smokeless tobacco: Never Used  Substance and Sexual Activity  . Alcohol use: Yes    Comment: 24 ounce beer and shot of liquor a day   . Drug use: No  . Sexual activity: Not on file  Lifestyle  . Physical activity    Days per week: Not on file    Minutes per session: Not on file  . Stress: Not on file  Relationships  . Social Herbalist on phone: Not on file    Gets together: Not on file    Attends religious service: Not on file    Active member of club or organization: Not on file    Attends meetings of clubs or organizations: Not on file    Relationship status: Not on file  . Intimate partner violence    Fear of current or ex partner: Not on file    Emotionally abused: Not on file    Physically abused: Not on file    Forced sexual activity: Not on file  Other Topics Concern  . Not on file  Social History Narrative  . Not on file    Review of Systems: Gen: See HPI CV: Denies chest pain, heart palpitations Resp: Denies shortness of breath or cough GI: See HPI  GU : Denies urinary burning, urinary frequency, urinary incontinence or hematuria.  MS: Denies joint pain Derm: Denies rash Psych: Denies depression, anxiety Heme: See HPI  Physical Exam: Vital signs in last 24 hours: Temp:  [99.3 F (37.4 C)] 99.3 F (37.4 C) (12/08 0800) Pulse Rate:  [91-101] 91 (12/08 0930) Resp:  [15-19] 15 (12/08 0930) BP: (117-128)/(82-90) 117/82 (12/08 0930) SpO2:  [94 %-96 %] 96 % (12/08  0930) Weight:  [81.6 kg] 81.6 kg (12/08 0757)   General:   Alert,  Well-developed, well-nourished, pleasant and cooperative in NAD Head:  Normocephalic and atraumatic. Eyes:  Sclera clear, no icterus.   Conjunctiva pink. Ears:  Normal auditory acuity. Lungs:  Clear throughout to auscultation.   No wheezes, crackles, or rhonchi. No acute distress. Heart:  Regular rate and rhythm; no murmurs, clicks, rubs,  or gallops. Abdomen:  Soft and nondistended.  Minimal tenderness to deep palpation in the epigastric area. No masses, hepatosplenomegaly or hernias noted. Normal bowel sounds, without guarding, and without rebound.   Rectal:  Deferred.   Msk:  Symmetrical without gross deformities. Normal posture. Extremities:  Without  edema. Neurologic:  Alert and  oriented x4;  grossly normal neurologically. Skin:  Intact without significant lesions or rashes. Psych:  Normal mood and affect.  Intake/Output from previous  day: No intake/output data recorded. Intake/Output this shift: No intake/output data recorded.  Lab Results: Recent Labs    05/02/19 0815  WBC 9.1  HGB 12.9*  HCT 38.8*  PLT 220   BMET Recent Labs    05/02/19 0815  NA 141  K 3.9  CL 108  CO2 20*  GLUCOSE 176*  BUN 17  CREATININE 0.86  CALCIUM 9.9   LFT Recent Labs    05/02/19 0815  PROT 8.0  ALBUMIN 3.4*  AST 65*  ALT 28  ALKPHOS 106  BILITOT 1.3*   Studies/Results: Dg Chest Portable 1 View  Result Date: 05/02/2019 CLINICAL DATA:  Pt c/o hematemesis this AM. Hx HTN, nonsmoker emesis EXAM: PORTABLE CHEST 1 VIEW COMPARISON:  10/14/2007 FINDINGS: The heart size and mediastinal contours are within normal limits. Both lungs are clear. The visualized skeletal structures are unremarkable. Appearance of the UPPER abdomen is unremarkable. IMPRESSION: Negative exam. Electronically Signed   By: Nolon Nations M.D.   On: 05/02/2019 08:28    Impression: 48 year old male with a history of hypertension and daily  alcohol use who presented to the emergency department the morning of 05/02/2019 for hematemesis.  Patient was in his normal state of health on 05/01/2019 other than mild nausea that resolved after drinking milk.  Woke up around 3 AM on 12/8 with nausea and hematemesis.  Reports 5-6 episodes of hematemesis this morning, none in the last couple of hours.  Denies abdominal pain, GERD symptoms, dysphagia, bright red blood per rectum, melena, unintentional weight loss, or NSAID use.   He has remained hemodynamically stable in the ED.  Hemoglobin slightly low at 12.9.  Last hemoglobin in our system 14.8 in 2009.  He has received IV Protonix 40 mg, Zofran, and was started on octreotide.  Last meal around 8-9 PM yesterday.  Drank about 8 ounces of apple juice around 5 AM this morning.  EGD in 2008 as per above. It was suspected patient had trivial upper GI bleed likely secondary to Mallory-Weiss tear equivalent with some trauma to the proximal gastric mucosa related to heaving/vomiting.  Recommendations to continue PPI indefinitely. He is not on a PPI.   Differentials include alcohol induced gastritis, esophagitis, duodenitis, peptic ulcer disease, H. pylori, Mallory-Weiss tear, and less likely malignancy.  We will plan for EGD with propofol with Dr. Oneida Alar today pending his COVID-19 test results. The risks, benefits, and alternatives have been discussed in detail with patient. They have stated understanding and desire to proceed.   Elevated AST: AST elevated at 65 and total bilirubin slightly elevated at 1.3.  ALT and alk phos normal.  In 2009, AST and ALT slightly elevated at 52 and 61 respectively. He does drink alcohol daily which I suspect is likely the cause of his mild ALT elevation.  No overt signs or symptoms of advanced liver disease.  Platelets normal at 220.  No recent imaging on file.  This can be followed up outpatient.  Plan: Plan for EGD with propofol with Dr. Oneida Alar today pending COVID-19 test results.  The risks, benefits, and alternatives have been discussed in detail with patient. They have stated understanding and desire to proceed.   Further recommendations to follow EGD.  Remain n.p.o. for now. Continue to monitor for ongoing hematemesis. Monitor H/H and transfuse as necessary. If EGD is not completed today, order will need to be placed for IV Protonix 40 mg twice daily.   LOS: 0 days    05/02/2019, 9:33 AM   Cyril Mourning  [facility]

## 2019-05-03 ENCOUNTER — Telehealth: Payer: Self-pay | Admitting: Gastroenterology

## 2019-05-03 ENCOUNTER — Inpatient Hospital Stay (HOSPITAL_COMMUNITY): Payer: Self-pay

## 2019-05-03 DIAGNOSIS — F1010 Alcohol abuse, uncomplicated: Secondary | ICD-10-CM

## 2019-05-03 DIAGNOSIS — R748 Abnormal levels of other serum enzymes: Secondary | ICD-10-CM

## 2019-05-03 LAB — COMPREHENSIVE METABOLIC PANEL
ALT: 22 U/L (ref 0–44)
AST: 56 U/L — ABNORMAL HIGH (ref 15–41)
Albumin: 2.9 g/dL — ABNORMAL LOW (ref 3.5–5.0)
Alkaline Phosphatase: 76 U/L (ref 38–126)
Anion gap: 10 (ref 5–15)
BUN: 15 mg/dL (ref 6–20)
CO2: 24 mmol/L (ref 22–32)
Calcium: 8.8 mg/dL — ABNORMAL LOW (ref 8.9–10.3)
Chloride: 105 mmol/L (ref 98–111)
Creatinine, Ser: 1.04 mg/dL (ref 0.61–1.24)
GFR calc Af Amer: >60 mL/min (ref >60)
GFR calc non Af Amer: >60 mL/min (ref >60)
Glucose, Bld: 148 mg/dL — ABNORMAL HIGH (ref 70–99)
Potassium: 3.7 mmol/L (ref 3.5–5.1)
Sodium: 139 mmol/L (ref 135–145)
Total Bilirubin: 2.2 mg/dL — ABNORMAL HIGH (ref 0.3–1.2)
Total Protein: 6.7 g/dL (ref 6.5–8.1)

## 2019-05-03 LAB — HEPATITIS C ANTIBODY: HCV Ab: NONREACTIVE

## 2019-05-03 LAB — CBC WITH DIFFERENTIAL/PLATELET
Abs Immature Granulocytes: 0.03 10*3/uL (ref 0.00–0.07)
Basophils Absolute: 0 10*3/uL (ref 0.0–0.1)
Basophils Relative: 0 %
Eosinophils Absolute: 0.1 10*3/uL (ref 0.0–0.5)
Eosinophils Relative: 1 %
HCT: 33.2 % — ABNORMAL LOW (ref 39.0–52.0)
Hemoglobin: 10.8 g/dL — ABNORMAL LOW (ref 13.0–17.0)
Immature Granulocytes: 0 %
Lymphocytes Relative: 14 %
Lymphs Abs: 1.5 10*3/uL (ref 0.7–4.0)
MCH: 30.5 pg (ref 26.0–34.0)
MCHC: 32.5 g/dL (ref 30.0–36.0)
MCV: 93.8 fL (ref 80.0–100.0)
Monocytes Absolute: 1.2 10*3/uL — ABNORMAL HIGH (ref 0.1–1.0)
Monocytes Relative: 11 %
Neutro Abs: 7.9 10*3/uL — ABNORMAL HIGH (ref 1.7–7.7)
Neutrophils Relative %: 74 %
Platelets: 162 10*3/uL (ref 150–400)
RBC: 3.54 MIL/uL — ABNORMAL LOW (ref 4.22–5.81)
RDW: 14.8 % (ref 11.5–15.5)
WBC: 10.8 10*3/uL — ABNORMAL HIGH (ref 4.0–10.5)
nRBC: 0 % (ref 0.0–0.2)

## 2019-05-03 LAB — HEPATITIS B SURFACE ANTIGEN: Hepatitis B Surface Ag: NONREACTIVE

## 2019-05-03 LAB — HEPATITIS A ANTIBODY, TOTAL: hep A Total Ab: REACTIVE — AB

## 2019-05-03 LAB — HEPATITIS B SURFACE ANTIBODY,QUALITATIVE: Hep B S Ab: NONREACTIVE

## 2019-05-03 LAB — PROTIME-INR
INR: 1.3 — ABNORMAL HIGH (ref 0.8–1.2)
Prothrombin Time: 16.1 seconds — ABNORMAL HIGH (ref 11.4–15.2)

## 2019-05-03 LAB — HEMOGLOBIN A1C
Hgb A1c MFr Bld: 6.1 % — ABNORMAL HIGH (ref 4.8–5.6)
Mean Plasma Glucose: 128.37 mg/dL

## 2019-05-03 LAB — HIV ANTIBODY (ROUTINE TESTING W REFLEX): HIV Screen 4th Generation wRfx: NONREACTIVE

## 2019-05-03 MED ORDER — OCTREOTIDE ACETATE 500 MCG/ML IJ SOLN
INTRAMUSCULAR | Status: AC
  Filled 2019-05-03: qty 1

## 2019-05-03 MED ORDER — NADOLOL 20 MG PO TABS
20.0000 mg | ORAL_TABLET | Freq: Every day | ORAL | Status: DC
  Administered 2019-05-03 – 2019-05-04 (×2): 20 mg via ORAL
  Filled 2019-05-03 (×6): qty 1

## 2019-05-03 MED ORDER — CIPROFLOXACIN HCL 250 MG PO TABS
500.0000 mg | ORAL_TABLET | Freq: Two times a day (BID) | ORAL | Status: DC
  Administered 2019-05-03 – 2019-05-05 (×5): 500 mg via ORAL
  Filled 2019-05-03 (×5): qty 2

## 2019-05-03 NOTE — Telephone Encounter (Signed)
[facility] Clinical Pool: Patient is currently inpatient for hematemesis.  EGD on 05/02/2019 revealed esophageal varices s/p banded.  Per RMR, patient needs repeat EGD with banding with propofol in 4 weeks.

## 2019-05-03 NOTE — Progress Notes (Addendum)
Subjective: Didn't sleep well last night. No nausea or vomiting. Ate a small amount of breakfast this morning. Tolerated it fine. No trouble swallowing. Just doesn't have much of an appetite. No BM since admission. Reports "hunger pains" but no other abdominal pain.   Denies any family history of liver disease.  No known family history of autoimmune conditions including thyroid disorders, celiac disease, inflammatory bowel disease, alpha-1 antitrypsin deficiency, or Wilson's disease.  Reports several years ago, he used to drink a bit more than he does now with about two 40 ounce beer a day and may drink more on his off days with friends.  Denies any history of IV or intranasal drug use.   States he is definitely going to stop drinking.  Does not think he will have a problem with this.  Objective: Vital signs in last 24 hours: Temp:  [97.8 F (36.6 C)-99.7 F (37.6 C)] 98.9 F (37.2 C) (12/09 0445) Pulse Rate:  [78-110] 107 (12/09 0445) Resp:  [15-20] 18 (12/09 0445) BP: (98-139)/(68-98) 116/76 (12/09 0445) SpO2:  [91 %-99 %] 95 % (12/09 0445) Weight:  [81.6 kg] 81.6 kg (12/08 0757)   General:   Alert and oriented, pleasant, teary-eyed when discussing possible underlying cirrhosis. Head:  Normocephalic and atraumatic. Eyes:  No scleral icterus. Conjuctiva pink.  Abdomen:  Bowel sounds present, somewhat protuberant abdomen but soft and non-tender. No HSM or hernias noted. No rebound or guarding. No masses appreciated.  Msk:  Symmetrical without gross deformities.  Extremities:  Without edema. Neurologic:  Alert and  oriented x4;  grossly normal neurologically. Skin:  Warm and dry, intact without significant lesions.  Psych: Normal mood and affect.  Intake/Output from previous day: 12/08 0701 - 12/09 0700 In: 2612.5 [P.O.:240; I.V.:2127.5; IV Piggyback:245] Out: 300 [Urine:300] Intake/Output this shift: No intake/output data recorded.  Lab Results: Recent Labs     05/02/19 0815 05/02/19 2144  WBC 9.1 11.8*  HGB 12.9* 12.4*  HCT 38.8* 37.3*  PLT 220 209   BMET Recent Labs    05/02/19 0815 05/03/19 0612  NA 141 139  K 3.9 3.7  CL 108 105  CO2 20* 24  GLUCOSE 176* 148*  BUN 17 15  CREATININE 0.86 1.04  CALCIUM 9.9 8.8*   LFT Recent Labs    05/02/19 0815 05/03/19 0612  PROT 8.0 6.7  ALBUMIN 3.4* 2.9*  AST 65* 56*  ALT 28 22  ALKPHOS 106 76  BILITOT 1.3* 2.2*   PT/INR Recent Labs    05/03/19 0612  LABPROT 16.1*  INR 1.3*    Studies/Results: Dg Chest Portable 1 View  Result Date: 05/02/2019 CLINICAL DATA:  Pt c/o hematemesis this AM. Hx HTN, nonsmoker emesis EXAM: PORTABLE CHEST 1 VIEW COMPARISON:  10/14/2007 FINDINGS: The heart size and mediastinal contours are within normal limits. Both lungs are clear. The visualized skeletal structures are unremarkable. Appearance of the UPPER abdomen is unremarkable. IMPRESSION: Negative exam. Electronically Signed   By: Nolon Nations M.D.   On: 05/02/2019 08:28    Assessment: 48 year old male with a history of hypertension and daily alcohol use who presented to the emergency department the morning of 05/02/2019 for hematemesis.  Hemoglobin 12.9 on admission (unknown baseline). EGD on 05/02/2019 with grade 2 and grade 3 esophageal varices with stigmata of recent bleeding completely eradicated via banding, portal hypertensive gastropathy, and mild gastritis biopsied.  Recommendations to continue octreotide GGT for 72 hours, Protonix twice daily for 1 month then once daily, Cipro twice daily  x7 days.  Repeat EGD in 2 weeks. Hemoglobin yesterday evening 12.4.  No overt GI bleeding since yesterday morning.  White count up to 11.8 yesterday afternoon.  No fever, no abdominal pain, and he is on appropriate prophylactic antibiotics at this time.  Suspect white count will return to normal. Continue to monitor.   Elevated liver enzymes/possible cirrhosis: History of elevated liver enzymes dating back  at least to 2009 in our system.  Elevation at its highest in 2013 with AST 267 and ALT 267.  On admission (12/8), AST elevated at 65 and total bilirubin slightly elevated at 1.3.  ALT and alk phos normal.  Platelets normal. Underwent EGD for hematemesis which revealed grade 2 and grade 3 esophageal varices s/p banding as well as portal hypertensive gastropathy.  Otherwise, no overt signs or symptoms of decompensated liver disease.  Today, AST down to 56, total bilirubin up to 2.2 (1.3 on 12/8), ALT and alk phos remain normal. INR slightly elevated at 1.3. Hepatitis A, B, C, and HIV pending.  Ultrasound abdomen today. Suspect patient has cirrhosis secondary to chronic alcohol use. Denies history of drug use or family history of liver disease or other autoimmune conditions. Bilirubin increase today may be secondary to UGI bleed.   Plan: 1. Check CBC today to follow hemoglobin and WBC trend.   2. Continue to trend LFTs and bilirubin with HFP tomorrow.  3. Monitor for overt GI bleeding.  4. Continue octreotide GGT.  End date should be 05/05/2019. 5. Continue Protonix 40 mg twice daily x1 month then daily. 6. Continue Cipro x7 days. 7. Follow-up on hepatitis A, B, C, and HIV as become available. 8. Follow-up on US Abdomen as it becomes available.  9. Will need to check AFP once cirrhosis is confirmed.  10. Patient will need repeat EGD with Dr. Gala Romney with propofol in 2 weeks for esophageal banding. 11. Discussed importance of complete alcohol abstinence.  12. Limit tylenol use. No more than 2 g daily.  13. Will need ongoing outpatient follow-up for likely cirrhosis.   Addendum: Spoke with Dr. Gala Romney. Prefers repeat EGD with propofol for banding in 4 weeks rather than 2 week. Will go ahead and send telephone note to [place] to get this arranged.    LOS: 1 day    05/03/2019, 7:49 AM   Aliene Altes, PA-C [facility]

## 2019-05-03 NOTE — Progress Notes (Signed)
Patient Demographics:    Cody [name], is a 48 y.o. male  MRN: [id number]   DOB - 05/06/1971  Admit Date - 05/02/2019  [facility]   Assessment & Plan:    Principal Problem:   Esophageal varices with bleeding [facility](HCC) Active Problems:   Essential hypertension   Hematemesis with nausea   GI bleed   Alcohol abuse   Elevated liver enzymes   EGD 05/02/2019 Impression:-  - Grade II and grade III esophageal varices with  stigmata of recent bleeding. Completely eradicated. Banded.   - Portal hypertensive gastropathy,- MILD Gastritis. Biopsied  1) acute GI bleed secondary to esophageal varices with bleeding----status post EGD with banding  -Discussed with GI physician Dr. Darrick PennaFields -Treat with continuous IV octreotide drip for 72 hours through 05/05/2019 -Oral Protonix 40 mg twice daily switched to daily dosing after 1 month -Hemoglobin is currently 12.9, no recent baseline available -Check serial H&H and transfuse as clinically indicated -Given portal hypertension esophageal varices avoid transfusion if hemoglobin above 8 (higher threshold for transfusion) GI soft diet as ordered -Patient will be started on nadolol, to assist with portal HTN.  2)Alcohol Abuse--- risk for delirium tremens -Lorazepam per CIWA protocol as ordered -No withdrawal symptoms currently. -Cessation counseling has been provided. -Patient motivated to quit. -Folic acid and thiamine  3)Possible Liver Cirrhosis -esophageal varices and portal hypertension as noted on EGD acute hepatitis profile pending,  -Follow abdominal ultrasound: pending -AST is 65 with ALT of 28, T bili is 1.3; will continue to monitor. -Follow GI service recommendations.  4) hyperglycemia-prediabetes  -random glucose was 176 on admission; no prior history of diabetes. -A1c 6.1 -Continue modified carbohydrate diet -Patient in the prediabetic range.  5)HTN -Patient will be started on nadolol -heart healthy diet encouraged.  -BP stable; will follow VS   Past Medical History:  Diagnosis Date  . Hypertension       Past Surgical History:  Procedure Laterality Date  . ESOPHAGOGASTRODUODENOSCOPY  02/10/2007   Dr. Jena Gaussourk; distal esophageal erosions consistent with reflux esophagitis, moderate sized hiatal hernia, submucosal gastric hemorrhage.  . TONSILLECTOMY  childhood    Chief Complaint  Patient presents with  . Hematemesis    EGD 05/02/2019 Impression:-  - Grade II and grade III esophageal varices with  stigmata of recent bleeding. Completely eradicated. Banded.   - Portal hypertensive gastropathy,- MILD Gastritis. Biopsied   Review of systems:    In addition to the HPI above,   A full Review of  Systems was done, all other systems reviewed are negative except as noted above in HPI , .    Social History:  Reviewed by me    Social History   Tobacco Use  . Smoking status: Never Smoker  . Smokeless tobacco: Never Used  Substance Use Topics  . Alcohol use: Yes    Comment: 24 ounce beer and shot of liquor a day  Family History :  Reviewed by me    Family History  Problem Relation Age of Onset  . Colon cancer Neg Hx   . Stomach cancer Neg Hx   . Esophageal cancer Neg Hx     Home Medications:   Prior to Admission medications   Medication Sig Start Date End Date Taking? Authorizing Provider  amLODipine (NORVASC) 5 MG tablet Take 5 mg by mouth daily. 11/21/18  Yes [provider]  metoprolol tartrate (LOPRESSOR) 25 MG tablet Take 25 mg by mouth 2 (two) times daily. 11/21/18  Yes [provider]     Allergies:    No Known Allergies   Physical Exam:   Vitals  Blood pressure 116/76, pulse (!) 107, temperature 98.9 F (37.2 C),  temperature source Oral, resp. rate 18, height 5\' 9"  (1.753 m), weight 81.6 kg, SpO2 95 %.  General exam: Alert, awake, oriented x 3; denies any further episode of nausea or vomiting.  No hematemesis.  Reports having some hunger pains, but no other complaints. Respiratory system: Clear to auscultation. Respiratory effort normal. Cardiovascular system:RRR. No murmurs, rubs, gallops. Gastrointestinal system: Abdomen is soft and nontender. No organomegaly or masses felt. Normal bowel sounds heard. Central nervous system: Alert and oriented. No focal neurological deficits. Extremities: No C/C/E, +pedal pulses Skin: No rashes, lesions or ulcers Psychiatry: Judgement and insight appear normal. Mood & affect appropriate.     Data Review:    CBC Recent Labs  Lab 05/02/19 0815 05/02/19 2144  WBC 9.1 11.8*  HGB 12.9* 12.4*  HCT 38.8* 37.3*  PLT 220 209  MCV 91.9 92.6  MCH 30.6 30.8  MCHC 33.2 33.2  RDW 14.6 14.8  LYMPHSABS 1.5  --   MONOABS 1.3*  --   EOSABS 0.0  --   BASOSABS 0.0  --    ------------------------------------------------------------------------------------------------------------------  Chemistries  Recent Labs  Lab 05/02/19 0815 05/02/19 2144 05/03/19 0612  NA 141  --  139  K 3.9  --  3.7  CL 108  --  105  CO2 20*  --  24  GLUCOSE 176*  --  148*  BUN 17  --  15  CREATININE 0.86  --  1.04  CALCIUM 9.9  --  8.8*  MG  --  1.4*  --   AST 65*  --  56*  ALT 28  --  22  ALKPHOS 106  --  76  BILITOT 1.3*  --  2.2*   ------------------------------------------------------------------------------------------------------------------ estimated creatinine clearance is 86.9 mL/min (by C-G formula based on SCr of 1.04 mg/dL). ------------------------------------------------------------------------------------------------------------------ No results for input(s): TSH, T4TOTAL, T3FREE, THYROIDAB in the last 72 hours.  Invalid input(s): FREET3   Coagulation profile  Recent Labs  Lab 05/03/19 0612  INR 1.3*   ------------------------------------------------------------------------------------------------------------------- No results for input(s): DDIMER in the last 72 hours. -------------------------------------------------------------------------------------------------------------------  Cardiac Enzymes No results for input(s): CKMB, TROPONINI, MYOGLOBIN in the last 168 hours.  Invalid input(s): CK ------------------------------------------------------------------------------------------------------------------ No results found for: BNP   ---------------------------------------------------------------------------------------------------------------  Urinalysis    Component Value Date/Time   COLORURINE AMBER (A) 05/02/2019 0810   APPEARANCEUR CLEAR 05/02/2019 0810   LABSPEC 1.032 (H) 05/02/2019 0810   PHURINE 5.0 05/02/2019 0810   GLUCOSEU NEGATIVE 05/02/2019 0810   HGBUR NEGATIVE 05/02/2019 0810   BILIRUBINUR SMALL (A) 05/02/2019 0810   KETONESUR 20 (A) 05/02/2019 0810   PROTEINUR 30 (A) 05/02/2019 0810   NITRITE NEGATIVE 05/02/2019 0810   LEUKOCYTESUR NEGATIVE 05/02/2019 0810    ----------------------------------------------------------------------------------------------------------------   Imaging  Results:    Dg Chest Portable 1 View  Result Date: 05/02/2019 CLINICAL DATA:  Pt c/o hematemesis this AM. Hx HTN, nonsmoker emesis EXAM: PORTABLE CHEST 1 VIEW COMPARISON:  10/14/2007 FINDINGS: The heart size and mediastinal contours are within normal limits. Both lungs are clear. The visualized skeletal structures are unremarkable. Appearance of the UPPER abdomen is unremarkable. IMPRESSION: Negative exam. Electronically Signed   By: Nolon Nations M.D.   On: 05/02/2019 08:28    Radiological Exams on Admission: Dg Chest Portable 1 View  Result Date: 05/02/2019 CLINICAL DATA:  Pt c/o hematemesis this AM. Hx HTN, nonsmoker emesis  EXAM: PORTABLE CHEST 1 VIEW COMPARISON:  10/14/2007 FINDINGS: The heart size and mediastinal contours are within normal limits. Both lungs are clear. The visualized skeletal structures are unremarkable. Appearance of the UPPER abdomen is unremarkable. IMPRESSION: Negative exam. Electronically Signed   By: Nolon Nations M.D.   On: 05/02/2019 08:28    DVT Prophylaxis -SCD (GI bleed) AM Labs Ordered, also please review Full Orders  Family Communication: Admission, patients condition and plan of care including tests being ordered have been discussed with the patient who indicate understanding and agree with the plan   Code Status - Full Code  Likely DC to  Home after completing 72 hours of octreotide infusion  Condition   Stable   Barton Dubois M.D on 05/03/2019 at 12:43 PM Go to [email or web address] -  for contact info  [facility] - Office  [phone]

## 2019-05-04 ENCOUNTER — Telehealth: Payer: Self-pay | Admitting: Gastroenterology

## 2019-05-04 DIAGNOSIS — I8511 Secondary esophageal varices with bleeding: Secondary | ICD-10-CM

## 2019-05-04 DIAGNOSIS — R748 Abnormal levels of other serum enzymes: Secondary | ICD-10-CM

## 2019-05-04 DIAGNOSIS — I8501 Esophageal varices with bleeding: Principal | ICD-10-CM

## 2019-05-04 LAB — HEPATIC FUNCTION PANEL
ALT: 26 U/L (ref 0–44)
AST: 105 U/L — ABNORMAL HIGH (ref 15–41)
Albumin: 2.8 g/dL — ABNORMAL LOW (ref 3.5–5.0)
Alkaline Phosphatase: 68 U/L (ref 38–126)
Bilirubin, Direct: 1.2 mg/dL — ABNORMAL HIGH (ref 0.0–0.2)
Indirect Bilirubin: 1.5 mg/dL — ABNORMAL HIGH (ref 0.3–0.9)
Total Bilirubin: 2.7 mg/dL — ABNORMAL HIGH (ref 0.3–1.2)
Total Protein: 6.8 g/dL (ref 6.5–8.1)

## 2019-05-04 NOTE — Progress Notes (Addendum)
Subjective:  No complaints. No abdominal pain. Feels hungry but does not enjoy food.   Objective: Vital signs in last 24 hours: Temp:  [98.6 F (37 C)-99.3 F (37.4 C)] 99 F (37.2 C) (12/10 0615) Pulse Rate:  [81-89] 89 (12/10 0615) Resp:  [18-20] 20 (12/09 1800) BP: (110-127)/(82-86) 119/86 (12/10 0615) SpO2:  [95 %-97 %] 95 % (12/10 0615) Last BM Date: 05/02/19 General:   Alert,  Well-developed, well-nourished, pleasant and cooperative in NAD Head:  Normocephalic and atraumatic. Eyes:  Sclera clear, no icterus.  Abdomen:  Soft, nontender and nondistended.  Normal bowel sounds,   Extremities:  Without clubbing, deformity or edema. Neurologic:  Alert and  oriented x4;  grossly normal neurologically. Skin:  Intact without significant lesions or rashes. Psych:  Alert and cooperative. Normal mood and affect.  Intake/Output from previous day: 12/09 0701 - 12/10 0700 In: 1998.5 [P.O.:960; I.V.:838.5; IV Piggyback:200] Out: -  Intake/Output this shift: No intake/output data recorded.  Lab Results: CBC Recent Labs    05/02/19 0815 05/02/19 2144 05/03/19 0612  WBC 9.1 11.8* 10.8*  HGB 12.9* 12.4* 10.8*  HCT 38.8* 37.3* 33.2*  MCV 91.9 92.6 93.8  PLT 220 209 162   BMET Recent Labs    05/02/19 0815 05/03/19 0612  NA 141 139  K 3.9 3.7  CL 108 105  CO2 20* 24  GLUCOSE 176* 148*  BUN 17 15  CREATININE 0.86 1.04  CALCIUM 9.9 8.8*   LFTs Recent Labs    05/02/19 0815 05/03/19 0612 05/04/19 0530  BILITOT 1.3* 2.2* 2.7*  BILIDIR  --   --  1.2*  IBILI  --   --  1.5*  ALKPHOS 106 76 68  AST 65* 56* 105*  ALT 28 22 26   PROT 8.0 6.7 6.8  ALBUMIN 3.4* 2.9* 2.8*   Recent Labs    05/02/19 0815  LIPASE 47   PT/INR Recent Labs    05/03/19 0612  LABPROT 16.1*  INR 1.3*      Imaging Studies: US Abdomen Complete  Result Date: 05/03/2019 CLINICAL DATA:  Elevated liver function tests. EXAM: ABDOMEN ULTRASOUND COMPLETE COMPARISON:  None. FINDINGS: Gallbladder:  No gallstones or wall thickening visualized. No sonographic Murphy sign noted by sonographer. Mild amount of sludge is noted in the gallbladder lumen. Common bile duct: Diameter: 2 mm which is within normal limits. Liver: No focal lesion identified. Mildly increased echogenicity of hepatic parenchyma is noted suggesting hepatic steatosis or other diffuse hepatocellular disease. Portal vein is patent on color Doppler imaging with normal direction of blood flow towards the liver. IVC: No abnormality visualized. Pancreas: Not well visualized due to overlying bowel gas. Spleen: Size and appearance within normal limits. Right Kidney: Length: 11 cm. Echogenicity within normal limits. No mass or hydronephrosis visualized. Left Kidney: Length: 12 cm. Echogenicity within normal limits. No mass or hydronephrosis visualized. Abdominal aorta: No aneurysm visualized. Other findings: Minimal ascites is noted in the gallbladder fossa. IMPRESSION: Mildly increased echogenicity of hepatic parenchyma is noted suggesting hepatic steatosis or other diffuse hepatocellular disease. Minimal para patent ascites is noted in the region of the gallbladder fossa. Pancreas not well visualized due to overlying bowel gas. No other abnormality seen in the abdomen. Electronically Signed   By: Marijo Conception M.D.   On: 05/03/2019 15:34   DG Chest Portable 1 View  Result Date: 05/02/2019 CLINICAL DATA:  Pt c/o hematemesis this AM. Hx HTN, nonsmoker emesis EXAM: PORTABLE CHEST 1 VIEW COMPARISON:  10/14/2007 FINDINGS: The  heart size and mediastinal contours are within normal limits. Both lungs are clear. The visualized skeletal structures are unremarkable. Appearance of the UPPER abdomen is unremarkable. IMPRESSION: Negative exam. Electronically Signed   By: Norva Pavlov M.D.   On: 05/02/2019 08:28  [2 weeks]   Assessment: 48 y/o male with HTN and daily etoh use presenting to ED 05/02/2019 for hematemesis. EGD same day showed grade 2/3  esophageal varices with stigmata of recent bleeding, portal HTN gastropathy, mild gastritis. He underwent variceal banding. Gastric biopsies pending.   Esophageal variceal bleeding: complete 72 hour octreotide GGT. Continue pantoprazole 40mg  BID for one month, then daily. Cipro BID for 7 days. Repeat EGD in four weeks. Hgb 10.8 yesterday. No further overt GI bleeding.  Elevated LFTs. H/o regular etoh use. On EGD Portal HTN gastropathy, esophageal varices. U/S with mildly increased echogenicity of hepatic parenchyma suggesting hepatic steatosis. Spleen normal in size. Reviewed U/S with radiologist, Dr. . Possibly some vague nodularity to the liver and some perihepatic ascites noted but no outright findings of cirrhosis but cannot exclude given some of the vague findings.  HIV, Hep B and C markers negative. He is not immune to Hep B. He IS immune to Hep A. AST up from 56 to 105 today.  Plan: 1. Alcohol cessation.  2. Continue octreotide today, ending tomorrow.  3. Pantoprazole 40mg  bid for one month, then daily. 4. Continue cipro for 7 days.  5. Repeat EGD with propofol in four weeks for esophageal banding.  6. Recommend Hep B vaccinations. 7. May require further imaging to define extent of liver disease and exclude over possible causes of non-cirrhotic portal hypertension.   Chilton [facility] [phone]  12/10/202010:44 AM  LOS: 2 days    Attending note: Patient seen and examined no further recommendations at this time.

## 2019-05-04 NOTE — Telephone Encounter (Signed)
Patient currently admitted. Per CS will have to wait until d/c'd to schedule.

## 2019-05-04 NOTE — Telephone Encounter (Signed)
Need assistance with scheduling.

## 2019-05-04 NOTE — Telephone Encounter (Signed)
Please arrange for outpatient CT A/P with contrast for abnormal LFTs, evaluation for cirrhosis (u/s inconclusive), h/o esophageal variceal bleeding

## 2019-05-04 NOTE — Telephone Encounter (Signed)
Correction- 06/05/19

## 2019-05-04 NOTE — Telephone Encounter (Signed)
Appointment spot available on 06/04/18

## 2019-05-04 NOTE — Addendum Note (Signed)
Addended by: Cheron Every on: 05/04/2019 01:40 PM   Modules accepted: Orders

## 2019-05-04 NOTE — Progress Notes (Signed)
Patient Demographics:    Cody [name], is a 48 y.o. male  MRN: [id number]   DOB - Jun 26, 1970  Admit Date - 05/02/2019  Outpatient Primary MD for the patient is [facility] [place]:    Principal Problem:   Esophageal varices with bleeding [facility]) Active Problems:   Essential hypertension   Hematemesis with nausea   GI bleed   Alcohol abuse   Elevated liver enzymes   Esophageal varices with bleeding in diseases classified elsewhere ([place])   EGD 05/02/2019 Impression:-  - Grade II and grade III esophageal varices with  stigmata of recent bleeding. Completely eradicated. Banded.   - Portal hypertensive gastropathy,- MILD Gastritis. Biopsied  1) acute GI bleed secondary to esophageal varices with bleeding----status post EGD with banding  -Discussed with GI physician Dr. Oneida Alar -Treat with continuous IV octreotide drip for 72 hours through 05/05/2019 -Oral Protonix 40 mg twice daily switched to daily dosing after 1 month -Hemoglobin is currently 12.9, no recent baseline available -Check serial H&H and transfuse as clinically indicated -Given portal hypertension esophageal varices avoid transfusion if hemoglobin above 8 (higher threshold for transfusion) GI soft diet as ordered -Patient will be started on nadolol, to assist with portal HTN.  2)Alcohol Abuse--- risk for delirium tremens -Lorazepam per CIWA protocol as ordered -Patient has remained withdrawal symptoms free currently. -Cessation counseling has been provided. -Patient motivated to quit. -Continue folic acid and thiamine  3)Possible Liver Cirrhosis -esophageal varices and portal hypertension as noted on EGD acute hepatitis profile pending,  -Follow abdominal ultrasound: pending -AST is 65 with ALT of 28, T bili is  1.3; will continue to monitor. -Follow GI service recommendations.  4) hyperglycemia-prediabetes -random glucose was 176 on admission; no prior history of diabetes. -A1c 6.1 -Continue modified carbohydrate diet -Patient in the prediabetic range; no hypoglycemic medications needed at this moment.Marland Kitchen  5)HTN -Pressure stable -Continue nadolol -Follow vital signs and further adjust antihypertensive regimen as needed. -Heart healthy diet has been encouraged.   Past Medical History:  Diagnosis Date  . Hypertension       Past Surgical History:  Procedure Laterality Date  . ESOPHAGOGASTRODUODENOSCOPY  02/10/2007   Dr. Gala Romney; distal esophageal erosions consistent with reflux esophagitis, moderate sized hiatal hernia, submucosal gastric hemorrhage.  . ESOPHAGOGASTRODUODENOSCOPY (EGD) WITH PROPOFOL N/A 05/02/2019   Procedure: ESOPHAGOGASTRODUODENOSCOPY (EGD) WITH PROPOFOL;  Surgeon: Danie Binder, MD;  Location: [facility];  Service: Endoscopy;  Laterality: N/A;  . TONSILLECTOMY  childhood    Chief Complaint  Patient presents with  . Hematemesis    EGD 05/02/2019 Impression:-  - Grade II and grade III esophageal varices with  stigmata of recent bleeding. Completely eradicated. Banded.   - Portal hypertensive gastropathy,- MILD Gastritis. Biopsied   Social History:    Social History   Tobacco Use  . Smoking status: Never Smoker  . Smokeless tobacco: Never Used  Substance Use Topics  . Alcohol  use: Yes    Comment: 24 ounce beer and shot of liquor a day       Family History :  Reviewed by me    Family History  Problem Relation Age of Onset  . Colon cancer Neg Hx   . Stomach cancer Neg Hx   . Esophageal cancer Neg Hx     Home Medications:   Prior to Admission medications   Medication Sig Start Date End Date Taking? Authorizing Provider  amLODipine (NORVASC) 5 MG tablet Take 5 mg by mouth daily. 11/21/18  Yes [provider]  metoprolol tartrate (LOPRESSOR)  25 MG tablet Take 25 mg by mouth 2 (two) times daily. 11/21/18  Yes [provider]     Allergies:    No Known Allergies   Physical Exam:   Vitals  Blood pressure 119/86, pulse 89, temperature 99 F (37.2 C), temperature source Oral, resp. rate 20, height 5\' 9"  (1.753 m), weight 81.6 kg, SpO2 95 %.  General exam: Alert, awake, oriented x 3, denying abdominal pain, nausea, vomiting, hematemesis, chest pain, shortness of breath or any other complaints. Respiratory system: Clear to auscultation. Respiratory effort normal. Cardiovascular system:RRR. No murmurs, rubs, gallops. Gastrointestinal system: Abdomen is nondistended, soft and nontender. No organomegaly or masses felt. Normal bowel sounds heard. Central nervous system: Alert and oriented. No focal neurological deficits. Extremities: No C/C/E, +pedal pulses Skin: No rashes, lesions or ulcers Psychiatry: Judgement and insight appear normal. Mood & affect appropriate.     Data Review:    CBC Recent Labs  Lab 05/02/19 0815 05/02/19 2144 05/03/19 0612  WBC 9.1 11.8* 10.8*  HGB 12.9* 12.4* 10.8*  HCT 38.8* 37.3* 33.2*  PLT 220 209 162  MCV 91.9 92.6 93.8  MCH 30.6 30.8 30.5  MCHC 33.2 33.2 32.5  RDW 14.6 14.8 14.8  LYMPHSABS 1.5  --  1.5  MONOABS 1.3*  --  1.2*  EOSABS 0.0  --  0.1  BASOSABS 0.0  --  0.0    Chemistries  Recent Labs  Lab 05/02/19 0815 05/02/19 2144 05/03/19 0612 05/04/19 0530  NA 141  --  139  --   K 3.9  --  3.7  --   CL 108  --  105  --   CO2 20*  --  24  --   GLUCOSE 176*  --  148*  --   BUN 17  --  15  --   CREATININE 0.86  --  1.04  --   CALCIUM 9.9  --  8.8*  --   MG  --  1.4*  --   --   AST 65*  --  56* 105*  ALT 28  --  22 26  ALKPHOS 106  --  76 68  BILITOT 1.3*  --  2.2* 2.7*    Coagulation profile Recent Labs  Lab 05/03/19 0612  INR 1.3*    Urinalysis    Component Value Date/Time   COLORURINE AMBER (A) 05/02/2019 0810   APPEARANCEUR CLEAR 05/02/2019 0810    LABSPEC 1.032 (H) 05/02/2019 0810   PHURINE 5.0 05/02/2019 0810   GLUCOSEU NEGATIVE 05/02/2019 0810   HGBUR NEGATIVE 05/02/2019 0810   BILIRUBINUR SMALL (A) 05/02/2019 0810   KETONESUR 20 (A) 05/02/2019 0810   PROTEINUR 30 (A) 05/02/2019 0810   NITRITE NEGATIVE 05/02/2019 0810   LEUKOCYTESUR NEGATIVE 05/02/2019 0810    ----------------------------------------------------------------------------------------------------------------   Imaging Results:    US Abdomen Complete  Addendum Date: 05/04/2019   ADDENDUM  REPORT: 05/04/2019 10:39 ADDENDUM: There may be some minimal nodularity of the hepatic margins present which could suggest early hepatic cirrhosis. Electronically Signed   By: Lupita Raider M.D.   On: 05/04/2019 10:39   Result Date: 05/04/2019 CLINICAL DATA:  Elevated liver function tests. EXAM: ABDOMEN ULTRASOUND COMPLETE COMPARISON:  None. FINDINGS: Gallbladder: No gallstones or wall thickening visualized. No sonographic Murphy sign noted by sonographer. Mild amount of sludge is noted in the gallbladder lumen. Common bile duct: Diameter: 2 mm which is within normal limits. Liver: No focal lesion identified. Mildly increased echogenicity of hepatic parenchyma is noted suggesting hepatic steatosis or other diffuse hepatocellular disease. Portal vein is patent on color Doppler imaging with normal direction of blood flow towards the liver. IVC: No abnormality visualized. Pancreas: Not well visualized due to overlying bowel gas. Spleen: Size and appearance within normal limits. Right Kidney: Length: 11 cm. Echogenicity within normal limits. No mass or hydronephrosis visualized. Left Kidney: Length: 12 cm. Echogenicity within normal limits. No mass or hydronephrosis visualized. Abdominal aorta: No aneurysm visualized. Other findings: Minimal ascites is noted in the gallbladder fossa. IMPRESSION: Mildly increased echogenicity of hepatic parenchyma is noted suggesting hepatic steatosis or other  diffuse hepatocellular disease. Minimal para patent ascites is noted in the region of the gallbladder fossa. Pancreas not well visualized due to overlying bowel gas. No other abnormality seen in the abdomen. Electronically Signed: By: Lupita Raider M.D. On: 05/03/2019 15:34    Radiological Exams on Admission: US Abdomen Complete  Addendum Date: 05/04/2019   ADDENDUM REPORT: 05/04/2019 10:39 ADDENDUM: There may be some minimal nodularity of the hepatic margins present which could suggest early hepatic cirrhosis. Electronically Signed   By: Lupita Raider M.D.   On: 05/04/2019 10:39   Result Date: 05/04/2019 CLINICAL DATA:  Elevated liver function tests. EXAM: ABDOMEN ULTRASOUND COMPLETE COMPARISON:  None. FINDINGS: Gallbladder: No gallstones or wall thickening visualized. No sonographic Murphy sign noted by sonographer. Mild amount of sludge is noted in the gallbladder lumen. Common bile duct: Diameter: 2 mm which is within normal limits. Liver: No focal lesion identified. Mildly increased echogenicity of hepatic parenchyma is noted suggesting hepatic steatosis or other diffuse hepatocellular disease. Portal vein is patent on color Doppler imaging with normal direction of blood flow towards the liver. IVC: No abnormality visualized. Pancreas: Not well visualized due to overlying bowel gas. Spleen: Size and appearance within normal limits. Right Kidney: Length: 11 cm. Echogenicity within normal limits. No mass or hydronephrosis visualized. Left Kidney: Length: 12 cm. Echogenicity within normal limits. No mass or hydronephrosis visualized. Abdominal aorta: No aneurysm visualized. Other findings: Minimal ascites is noted in the gallbladder fossa. IMPRESSION: Mildly increased echogenicity of hepatic parenchyma is noted suggesting hepatic steatosis or other diffuse hepatocellular disease. Minimal para patent ascites is noted in the region of the gallbladder fossa. Pancreas not well visualized due to overlying  bowel gas. No other abnormality seen in the abdomen. Electronically Signed: By: Lupita Raider M.D. On: 05/03/2019 15:34    DVT Prophylaxis -SCD (GI bleed) AM Labs Ordered, also please review Full Orders  Family Communication: Admission, patients condition and plan of care including tests being ordered have been discussed with the patient who indicate understanding and agree with the plan   Code Status - Full Code  Likely DC to  Home after completing 72 hours of octreotide infusion  Condition   Stable   Vassie Loll M.D on 05/04/2019 at 4:50 PM [phone]  [facility] -  Office  [phone]

## 2019-05-05 MED ORDER — NADOLOL 20 MG PO TABS: 20.0000 mg | ORAL_TABLET | Freq: Every day | ORAL | 1 refills | Status: DC

## 2019-05-05 MED ORDER — PANTOPRAZOLE SODIUM 40 MG PO TBEC: DELAYED_RELEASE_TABLET | ORAL | 1 refills | Status: DC

## 2019-05-05 MED ORDER — CIPROFLOXACIN HCL 500 MG PO TABS: 500.0000 mg | ORAL_TABLET | Freq: Two times a day (BID) | ORAL | 0 refills | Status: AC

## 2019-05-05 MED ORDER — ADULT MULTIVITAMIN W/MINERALS CH: 1.0000 | ORAL_TABLET | Freq: Every day | ORAL | 3 refills | Status: AC

## 2019-05-05 MED ORDER — PNEUMOCOCCAL VAC POLYVALENT 25 MCG/0.5ML IJ INJ: 0.5000 mL | INJECTION | INTRAMUSCULAR | Status: DC

## 2019-05-05 NOTE — Plan of Care (Signed)
  Problem: Education: Goal: Knowledge of General Education information will improve Description: Including pain rating scale, medication(s)/side effects and non-pharmacologic comfort measures 05/05/2019 1230 by Santa Lighter, RN Outcome: Adequate for Discharge 05/05/2019 1229 by Santa Lighter, RN Outcome: Progressing   Problem: Clinical Measurements: Goal: Ability to maintain clinical measurements within normal limits will improve Outcome: Adequate for Discharge Goal: Will remain free from infection Outcome: Adequate for Discharge Goal: Diagnostic test results will improve Outcome: Adequate for Discharge Goal: Respiratory complications will improve Outcome: Adequate for Discharge Goal: Cardiovascular complication will be avoided Outcome: Adequate for Discharge   Problem: Activity: Goal: Risk for activity intolerance will decrease Outcome: Adequate for Discharge   Problem: Nutrition: Goal: Adequate nutrition will be maintained Outcome: Adequate for Discharge   Problem: Coping: Goal: Level of anxiety will decrease Outcome: Adequate for Discharge   Problem: Elimination: Goal: Will not experience complications related to bowel motility Outcome: Adequate for Discharge Goal: Will not experience complications related to urinary retention Outcome: Adequate for Discharge   Problem: Pain Managment: Goal: General experience of comfort will improve Outcome: Adequate for Discharge   Problem: Safety: Goal: Ability to remain free from injury will improve Outcome: Adequate for Discharge   Problem: Skin Integrity: Goal: Risk for impaired skin integrity will decrease Outcome: Adequate for Discharge

## 2019-05-05 NOTE — Telephone Encounter (Signed)
Noted. Will arrange with pt next week.

## 2019-05-05 NOTE — Telephone Encounter (Signed)
Cody [name]: Patient with discharge orders for today.   Alicia: can we recheck HFP, CBC next Wednesday as outpatient?

## 2019-05-05 NOTE — Progress Notes (Addendum)
Nsg Discharge Note  Admit Date:  05/02/2019 Discharge date: 05/05/2019   Toribio Harbour to be D/C'd Home per MD order.  AVS completed.  Copy for chart, and copy for patient signed, and dated. Removed IVs-clean, dry, intact. Melinda reviewed d/c paperwork with patient and wheeled stable patient to main entrance where he was picked up by his friend to d/c to home. Gave preprinted prescriptions. Patient/caregiver able to verbalize understanding.  Discharge Medication: Allergies as of 05/05/2019   No Known Allergies     Medication List    STOP taking these medications   amLODipine 5 MG tablet Commonly known as: NORVASC   metoprolol tartrate 25 MG tablet Commonly known as: LOPRESSOR     TAKE these medications   ciprofloxacin 500 MG tablet Commonly known as: CIPRO Take 1 tablet (500 mg total) by mouth 2 (two) times daily for 4 days.   multivitamin with minerals Tabs tablet Take 1 tablet by mouth daily.   nadolol 20 MG tablet Commonly known as: CORGARD Take 1 tablet (20 mg total) by mouth daily.   pantoprazole 40 MG tablet Commonly known as: PROTONIX Take 1 tab by mouth twice a day for 1 month; than 1 tablet daily.       Discharge Assessment: Vitals:   05/05/19 0000 05/05/19 0603  BP: 116/86 127/86  Pulse: 89 81  Resp: 18 18  Temp: 98.2 F (36.8 C) 98.4 F (36.9 C)  SpO2: 95% 98%   Skin clean, dry and intact without evidence of skin break down, no evidence of skin tears noted. IV catheter discontinued intact. Site without signs and symptoms of complications - no redness or edema noted at insertion site, patient denies c/o pain - only slight tenderness at site.  Dressing with slight pressure applied.  D/c Instructions-Education: Discharge instructions given to patient/family with verbalized understanding. D/c education completed with patient/family including follow up instructions, medication list, d/c activities limitations if indicated, with other d/c instructions  as indicated by MD - patient able to verbalize understanding, all questions fully answered. Patient instructed to return to ED, call 911, or call MD for any changes in condition.  Patient escorted via [place], and D/C home via private auto.  Santa Lighter, RN 05/05/2019 12:36 PM

## 2019-05-05 NOTE — Discharge Summary (Signed)
Physician Discharge Summary  Cody [name] ZOX:[id number] DOB: [birth year] DOA: 05/02/2019  PCP: Randell Patient [facility]  Admit date: 05/02/2019 Discharge date: 05/05/2019  Time spent: 35 minutes  Recommendations for Outpatient Follow-up:  1. Repeat CBC to follow hemoglobin 2. -Repeat complete metabolic panel to follow electrolytes, LFTs and renal function. 3. -Reassess blood pressure and adjust antihypertensive regimen as required 4. -Close monitoring of patient's CBGs as he was found to be in prediabetes range.   Discharge Diagnoses:  Principal Problem:   Esophageal varices with bleeding (HCC) Active Problems:   Essential hypertension   Hematemesis with nausea   GI bleed   Alcohol abuse   Elevated liver enzymes   Esophageal varices with bleeding in diseases classified elsewhere [facility])   Discharge Condition: Stable and improved.  Patient discharged home with instruction to follow-up with PCP in 10 days and to follow-up with gastroenterology service as scheduled (in 1 month).  Diet recommendation: Heart healthy/modified carbohydrate diet.  Filed Weights   05/02/19 0757  Weight: 81.6 kg    History of present illness:  As per H&P written by Dr. Mariea Clonts on 05/02/2019 Cody [name]  is a 48 y.o. male former smoker with ongoing alcohol abuse, HTN and GERD presents to the ED with multiple episodes of hematemesis of less than 24 hours duration--initially had dark red blood and then subsequently had coffee-ground -Patient admits to daily alcohol use specifically beer -No chest pains no palpitations no shortness of breath no dizziness no syncope -Denies hematochezia or significant melena -Denies NSAID use  In Ed--chest x-ray without acute findings, hemoglobin 12.9 without recent baseline,  -Lipase is not elevated, AST 65 total bili is 1.3 albumin is 3.4, ALT is not elevated -Elevated glucose noted at 176  Hospital Course:  1-acute GI bleed secondary to esophageal  varices -Status post EGD with banding -Patient treated with 72 hours of IV octreotide and PPI twice a day -Hemoglobin remained stable and no further signs of overt bleeding appreciated during hospitalization -No transfusion required. -At discharge patient will continue the use of PPI twice a day for 1 month with 40 mg daily after that. -Outpatient follow-up with GI service has been arranged -Will continue the use of nadolol to assist with portal hypertension  2-alcohol abuse: With concern for hepatic acidosis, mild transaminitis and early cirrhosis development -No withdrawal symptoms appreciated-extensive counseling for alcohol cessation counseling provided; patient motivated to quit. -At discharge will continue multivitamins to supplement folate and thiamine -Patient was observed on the CIWA protocol while hospitalized; no withdrawal appreciated.  3-hepatic asteatosis/liver cirrhosis-no definitive findings on abdominal ultrasound -AST minimally elevated at time of discharge for the rest of his LFTs stabilized -Patient advised to stop drinking -Continue to follow as an outpatient with gastroenterology service.  4-prediabetes/hyperglycemia -Value glucose 176 on admission -A1c 6.1 -Patient advised to follow modified carbohydrate diet -Close follow-up by PCP as he is in the prediabetic range; there is no need for hypoglycemic medications to be started at this time. -Lifestyle changes have been encouraged.  5-essential hypertension -stable and well-controlled -Continue nadolol.  Procedures: EGD 05/02/2019 Impression:-- Grade II and grade III esophageal varices with stigmata of recent bleeding. Completely eradicated. Banded. - Portal hypertensive gastropathy,- MILD Gastritis. Biopsied  Consultations:  GI service  Discharge Exam: Vitals:   05/05/19 0000 05/05/19 0603  BP: 116/86 127/86  Pulse: 89 81  Resp: 18 18  Temp: 98.2 F (36.8 C) 98.4 F (36.9 C)  SpO2: 95% 98%     General: Afebrile, in  no acute distress, not having any active withdrawal symptoms; no further nausea, vomiting or signs of overt bleeding. Cardiovascular: RRR, no rubs, no gallops, no murmur. Respiratory: Clear to auscultation bilaterally. Abdomen: Soft, nontender, positive bowel sounds. Extremities: No edema, no cyanosis or clubbing.  Discharge Instructions   Discharge Instructions    Diet - low sodium heart healthy   Complete by: As directed    Discharge instructions   Complete by: As directed    Stop alcohol consumption Maintain adequate hydration Follow heart healthy diet (monitoring sodium intake; looking for 2.5 g daily as a maximum dose). Take medications as prescribed Follow-up with PCP in 10 days Follow-up with gastroenterology service as instructed.     Allergies as of 05/05/2019   No Known Allergies     Medication List    STOP taking these medications   amLODipine 5 MG tablet Commonly known as: NORVASC   metoprolol tartrate 25 MG tablet Commonly known as: LOPRESSOR     TAKE these medications   ciprofloxacin 500 MG tablet Commonly known as: CIPRO Take 1 tablet (500 mg total) by mouth 2 (two) times daily for 4 days.   multivitamin with minerals Tabs tablet Take 1 tablet by mouth daily.   nadolol 20 MG tablet Commonly known as: CORGARD Take 1 tablet (20 mg total) by mouth daily.   pantoprazole 40 MG tablet Commonly known as: PROTONIX Take 1 tab by mouth twice a day for 1 month; than 1 tablet daily.      No Known Allergies Follow-up Information    [facility] Follow up in 10 day(s).   Contact information: [address] [place] [address] [phone]           The results of significant diagnostics from this hospitalization (including imaging, microbiology, ancillary and laboratory) are listed below for reference.    Significant Diagnostic Studies: US Abdomen Complete  Addendum Date: 05/04/2019   ADDENDUM  REPORT: 05/04/2019 10:39 ADDENDUM: There may be some minimal nodularity of the hepatic margins present which could suggest early hepatic cirrhosis. Electronically Signed   By: Lupita Raider M.D.   On: 05/04/2019 10:39   Result Date: 05/04/2019 CLINICAL DATA:  Elevated liver function tests. EXAM: ABDOMEN ULTRASOUND COMPLETE COMPARISON:  None. FINDINGS: Gallbladder: No gallstones or wall thickening visualized. No sonographic Murphy sign noted by sonographer. Mild amount of sludge is noted in the gallbladder lumen. Common bile duct: Diameter: 2 mm which is within normal limits. Liver: No focal lesion identified. Mildly increased echogenicity of hepatic parenchyma is noted suggesting hepatic steatosis or other diffuse hepatocellular disease. Portal vein is patent on color Doppler imaging with normal direction of blood flow towards the liver. IVC: No abnormality visualized. Pancreas: Not well visualized due to overlying bowel gas. Spleen: Size and appearance within normal limits. Right Kidney: Length: 11 cm. Echogenicity within normal limits. No mass or hydronephrosis visualized. Left Kidney: Length: 12 cm. Echogenicity within normal limits. No mass or hydronephrosis visualized. Abdominal aorta: No aneurysm visualized. Other findings: Minimal ascites is noted in the gallbladder fossa. IMPRESSION: Mildly increased echogenicity of hepatic parenchyma is noted suggesting hepatic steatosis or other diffuse hepatocellular disease. Minimal para patent ascites is noted in the region of the gallbladder fossa. Pancreas not well visualized due to overlying bowel gas. No other abnormality seen in the abdomen. Electronically Signed: By: Lupita Raider M.D. On: 05/03/2019 15:34   DG Chest Portable 1 View  Result Date: 05/02/2019 CLINICAL DATA:  Pt c/o hematemesis  this AM. Hx HTN, nonsmoker emesis EXAM: PORTABLE CHEST 1 VIEW COMPARISON:  10/14/2007 FINDINGS: The heart size and mediastinal contours are within normal limits.  Both lungs are clear. The visualized skeletal structures are unremarkable. Appearance of the UPPER abdomen is unremarkable. IMPRESSION: Negative exam. Electronically Signed   By: Nolon Nations M.D.   On: 05/02/2019 08:28    Microbiology: Recent Results (from the past 240 hour(s))  Respiratory Panel by RT PCR (Flu A&B, Covid) - Nasopharyngeal Swab     Status: None   Collection Time: 05/02/19  9:44 AM   Specimen: Nasopharyngeal Swab  Result Value Ref Range Status   SARS Coronavirus 2 by RT PCR NEGATIVE NEGATIVE Final    Comment: (NOTE) SARS-CoV-2 target nucleic acids are NOT DETECTED. The SARS-CoV-2 RNA is generally detectable in upper respiratoy specimens during the acute phase of infection. The lowest concentration of SARS-CoV-2 viral copies this assay can detect is 131 copies/mL. A negative result does not preclude SARS-Cov-2 infection and should not be used as the sole basis for treatment or other patient management decisions. A negative result may occur with  improper specimen collection/handling, submission of specimen other than nasopharyngeal swab, presence of viral mutation(s) within the areas targeted by this assay, and inadequate number of viral copies (<131 copies/mL). A negative result must be combined with clinical observations, patient history, and epidemiological information. The expected result is Negative. Fact Sheet for Patients:  PinkCheek.be Fact Sheet for Healthcare Providers:  GravelBags.it This test is not yet ap proved or cleared by the [place] FDA and  has been authorized for detection and/or diagnosis of SARS-CoV-2 by FDA under an Emergency Use Authorization (EUA). This EUA will remain  in effect (meaning this test can be used) for the duration of the COVID-19 declaration under Section 564(b)(1) of the Act, 21 U.S.C. section 360bbb-3(b)(1), unless the authorization is terminated or revoked  sooner.    Influenza A by PCR NEGATIVE NEGATIVE Final   Influenza B by PCR NEGATIVE NEGATIVE Final    Comment: (NOTE) The Xpert Xpress SARS-CoV-2/FLU/RSV assay is intended as an aid in  the diagnosis of influenza from Nasopharyngeal swab specimens and  should not be used as a sole basis for treatment. Nasal washings and  aspirates are unacceptable for Xpert Xpress SARS-CoV-2/FLU/RSV  testing. Fact Sheet for Patients: PinkCheek.be Fact Sheet for Healthcare Providers: GravelBags.it This test is not yet approved or cleared by the [place] FDA and  has been authorized for detection and/or diagnosis of SARS-CoV-2 by  FDA under an Emergency Use Authorization (EUA). This EUA will remain  in effect (meaning this test can be used) for the duration of the  Covid-19 declaration under Section 564(b)(1) of the Act, 21  U.S.C. section 360bbb-3(b)(1), unless the authorization is  terminated or revoked. Performed at [facility], [address] [place], [place] [address]      Labs: Basic Metabolic Panel: Recent Labs  Lab 05/02/19 0815 05/02/19 2144 05/03/19 0612  NA 141  --  139  K 3.9  --  3.7  CL 108  --  105  CO2 20*  --  24  GLUCOSE 176*  --  148*  BUN 17  --  15  CREATININE 0.86  --  1.04  CALCIUM 9.9  --  8.8*  MG  --  1.4*  --   PHOS  --  3.6  --    Liver Function Tests: Recent Labs  Lab 05/02/19 0815 05/03/19 0612 05/04/19 0530  AST 65*  56* 105*  ALT 28 22 26   ALKPHOS 106 76 68  BILITOT 1.3* 2.2* 2.7*  PROT 8.0 6.7 6.8  ALBUMIN 3.4* 2.9* 2.8*   Recent Labs  Lab 05/02/19 0815  LIPASE 47   CBC: Recent Labs  Lab 05/02/19 0815 05/02/19 2144 05/03/19 0612  WBC 9.1 11.8* 10.8*  NEUTROABS 6.2  --  7.9*  HGB 12.9* 12.4* 10.8*  HCT 38.8* 37.3* 33.2*  MCV 91.9 92.6 93.8  PLT 220 209 162   CBG: Recent Labs  Lab 05/02/19 1641  GLUCAP 124*    Signed:  Vassie Lollarlos Jacai Kipp MD.  [facility] 05/05/2019, 9:18 AM

## 2019-05-05 NOTE — Plan of Care (Signed)
  Problem: Education: Goal: Knowledge of General Education information will improve Description Including pain rating scale, medication(s)/side effects and non-pharmacologic comfort measures Outcome: Progressing   Problem: Health Behavior/Discharge Planning: Goal: Ability to manage health-related needs will improve Outcome: Progressing   

## 2019-05-08 ENCOUNTER — Other Ambulatory Visit: Payer: Self-pay

## 2019-05-08 DIAGNOSIS — I8501 Esophageal varices with bleeding: Secondary | ICD-10-CM

## 2019-05-08 NOTE — Telephone Encounter (Signed)
Called pt, EGD/banding w/Propofol w/RMR scheduled for 06/05/19 at 2:45pm. Orders entered.

## 2019-05-08 NOTE — Telephone Encounter (Signed)
COVID test and Pre-op appt 06/01/19. Appt letter mailed with procedure instructions.

## 2019-05-08 NOTE — Telephone Encounter (Signed)
CT scheduled for 12/31 at 2:00pm, arrival 1:45pm, npo 4 hrs prior, p/u oral prep from [facility]  Called pt, not able to leave VM. Letter mailed with appointment details

## 2019-05-08 NOTE — Telephone Encounter (Signed)
Spoke to pt, informed him of CT appt and letter mailed.

## 2019-05-11 ENCOUNTER — Other Ambulatory Visit: Payer: Self-pay

## 2019-05-11 DIAGNOSIS — R945 Abnormal results of liver function studies: Secondary | ICD-10-CM

## 2019-05-11 DIAGNOSIS — R7989 Other specified abnormal findings of blood chemistry: Secondary | ICD-10-CM

## 2019-05-11 NOTE — Telephone Encounter (Signed)
Spoke with pt. Lab orders were faxed to [facility]. Pt will have labs completed.

## 2019-05-12 ENCOUNTER — Other Ambulatory Visit: Payer: Self-pay

## 2019-05-12 ENCOUNTER — Other Ambulatory Visit (HOSPITAL_COMMUNITY)
Admission: RE | Admit: 2019-05-12 | Discharge: 2019-05-12 | Disposition: A | Discharge status: Home / Self Care | Payer: Self-pay | Source: Ambulatory Visit | Attending: Gastroenterology | Admitting: Gastroenterology

## 2019-05-12 DIAGNOSIS — R945 Abnormal results of liver function studies: Secondary | ICD-10-CM | POA: Insufficient documentation

## 2019-05-12 LAB — CBC WITH DIFFERENTIAL/PLATELET
Abs Immature Granulocytes: 0.01 10*3/uL (ref 0.00–0.07)
Basophils Absolute: 0.1 10*3/uL (ref 0.0–0.1)
Basophils Relative: 1 %
Eosinophils Absolute: 0.2 10*3/uL (ref 0.0–0.5)
Eosinophils Relative: 3 %
HCT: 39.6 % (ref 39.0–52.0)
Hemoglobin: 13 g/dL (ref 13.0–17.0)
Immature Granulocytes: 0 %
Lymphocytes Relative: 20 %
Lymphs Abs: 1.6 10*3/uL (ref 0.7–4.0)
MCH: 30.7 pg (ref 26.0–34.0)
MCHC: 32.8 g/dL (ref 30.0–36.0)
MCV: 93.4 fL (ref 80.0–100.0)
Monocytes Absolute: 1 10*3/uL (ref 0.1–1.0)
Monocytes Relative: 13 %
Neutro Abs: 5 10*3/uL (ref 1.7–7.7)
Neutrophils Relative %: 63 %
Platelets: 366 10*3/uL (ref 150–400)
RBC: 4.24 MIL/uL (ref 4.22–5.81)
RDW: 14.4 % (ref 11.5–15.5)
WBC: 7.8 10*3/uL (ref 4.0–10.5)
nRBC: 0 % (ref 0.0–0.2)

## 2019-05-12 LAB — HEPATIC FUNCTION PANEL
ALT: 69 U/L — ABNORMAL HIGH (ref 0–44)
AST: 113 U/L — ABNORMAL HIGH (ref 15–41)
Albumin: 3.7 g/dL (ref 3.5–5.0)
Alkaline Phosphatase: 135 U/L — ABNORMAL HIGH (ref 38–126)
Bilirubin, Direct: 0.5 mg/dL — ABNORMAL HIGH (ref 0.0–0.2)
Indirect Bilirubin: 1 mg/dL — ABNORMAL HIGH (ref 0.3–0.9)
Total Bilirubin: 1.5 mg/dL — ABNORMAL HIGH (ref 0.3–1.2)
Total Protein: 8.6 g/dL — ABNORMAL HIGH (ref 6.5–8.1)

## 2019-05-23 ENCOUNTER — Telehealth: Payer: Self-pay

## 2019-05-23 NOTE — Telephone Encounter (Signed)
VM received on 05/23/2019 @ 9:49 PM. Pt wanted to discuss the CT scan scheduled for this Thursday. Returned pts call. Pt was informed that his CT scan is this Thursday 05/25/2019 at [facility]. Arrival time is 1:45PM per the letter mailed to pt. Pt was reminded to pick up his contrast on

## 2019-05-25 ENCOUNTER — Other Ambulatory Visit: Payer: Self-pay

## 2019-05-25 ENCOUNTER — Ambulatory Visit (HOSPITAL_COMMUNITY)
Admission: RE | Admit: 2019-05-25 | Discharge: 2019-05-25 | Disposition: A | Discharge status: Home / Self Care | Payer: Self-pay | Source: Ambulatory Visit | Attending: Gastroenterology | Admitting: Gastroenterology

## 2019-05-25 DIAGNOSIS — I8501 Esophageal varices with bleeding: Secondary | ICD-10-CM | POA: Insufficient documentation

## 2019-05-25 DIAGNOSIS — R748 Abnormal levels of other serum enzymes: Secondary | ICD-10-CM | POA: Insufficient documentation

## 2019-05-25 MED ORDER — IOHEXOL 300 MG/ML  SOLN
100.0000 mL | Freq: Once | INTRAMUSCULAR | Status: AC | PRN
  Administered 2019-05-25: 100 mL via INTRAVENOUS

## 2019-05-30 ENCOUNTER — Other Ambulatory Visit: Payer: Self-pay

## 2019-05-30 DIAGNOSIS — R7989 Other specified abnormal findings of blood chemistry: Secondary | ICD-10-CM

## 2019-05-30 DIAGNOSIS — R945 Abnormal results of liver function studies: Secondary | ICD-10-CM

## 2019-05-30 DIAGNOSIS — K769 Liver disease, unspecified: Secondary | ICD-10-CM

## 2019-05-31 ENCOUNTER — Other Ambulatory Visit: Payer: Self-pay

## 2019-05-31 DIAGNOSIS — Z79899 Other long term (current) drug therapy: Secondary | ICD-10-CM

## 2019-05-31 DIAGNOSIS — R945 Abnormal results of liver function studies: Secondary | ICD-10-CM

## 2019-05-31 NOTE — Patient Instructions (Signed)
CLINT BIELLO  05/31/2019     @PREFPERIOPPHARMACY @   Your procedure is scheduled on  06/05/2019  Report to [facility] at  1315 (1:15)  P.M.  Call this number if you have problems the morning of surgery:  [phone]   Remember:  Follow the diet instructions given to you by Dr Roseanne Kaufman office.                 Take these medicines the morning of surgery with A SIP OF WATER  Nadolol, pantoprazole.   Do not wear jewelry, make-up or nail polish.  Do not wear lotions, powders, or perfumes. Please wear deodorant and brush your teeth.  Do not shave 48 hours prior to surgery.  Men may shave face and neck.  Do not bring valuables to the hospital.  [facility] is not responsible for any belongings or valuables.  Contacts, dentures or bridgework may not be worn into surgery.  Leave your suitcase in the car.  After surgery it may be brought to your room.  For patients admitted to the hospital, discharge time will be determined by your treatment team.  Patients discharged the day of surgery will not be allowed to drive home.   Name and phone number of your driver:   family Special instructions:  None  Please read over the following fact sheets that you were given. Anesthesia Post-op Instructions and Care and Recovery After Surgery       Upper Endoscopy, Adult, Care After This sheet gives you information about how to care for yourself after your procedure. Your health care provider may also give you more specific instructions. If you have problems or questions, contact your health care provider. What can I expect after the procedure? After the procedure, it is common to have:  A sore throat.  Mild stomach pain or discomfort.  Bloating.  Nausea. Follow these instructions at home:   Follow instructions from your health care provider about what to eat or drink after your procedure.  Return to your normal activities as told by your health care provider. Ask your health  care provider what activities are safe for you.  Take over-the-counter and prescription medicines only as told by your health care provider.  Do not drive for 24 hours if you were given a sedative during your procedure.  Keep all follow-up visits as told by your health care provider. This is important. Contact a health care provider if you have:  A sore throat that lasts longer than one day.  Trouble swallowing. Get help right away if:  You vomit blood or your vomit looks like coffee grounds.  You have: ? A fever. ? Bloody, black, or tarry stools. ? A severe sore throat or you cannot swallow. ? Difficulty breathing. ? Severe pain in your chest or abdomen. Summary  After the procedure, it is common to have a sore throat, mild stomach discomfort, bloating, and nausea.  Do not drive for 24 hours if you were given a sedative during the procedure.  Follow instructions from your health care provider about what to eat or drink after your procedure.  Return to your normal activities as told by your health care provider. This information is not intended to replace advice given to you by your health care provider. Make sure you discuss any questions you have with your health care provider. Document Revised: 11/02/2017 Document Reviewed: 10/11/2017 Elsevier Patient Education  [place], Care  After These instructions provide you with information about caring for yourself after your procedure. Your health care provider may also give you more specific instructions. Your treatment has been planned according to current medical practices, but problems sometimes occur. Call your health care provider if you have any problems or questions after your procedure. What can I expect after the procedure? After your procedure, you may:  Feel sleepy for several hours.  Feel clumsy and have poor balance for several hours.  Feel forgetful about what happened after the  procedure.  Have poor judgment for several hours.  Feel nauseous or vomit.  Have a sore throat if you had a breathing tube during the procedure. Follow these instructions at home: For at least 24 hours after the procedure:      Have a responsible adult stay with you. It is important to have someone help care for you until you are awake and alert.  Rest as needed.  Do not: ? Participate in activities in which you could fall or become injured. ? Drive. ? Use heavy machinery. ? Drink alcohol. ? Take sleeping pills or medicines that cause drowsiness. ? Make important decisions or sign legal documents. ? Take care of children on your own. Eating and drinking  Follow the diet that is recommended by your health care provider.  If you vomit, drink water, juice, or soup when you can drink without vomiting.  Make sure you have little or no nausea before eating solid foods. General instructions  Take over-the-counter and prescription medicines only as told by your health care provider.  If you have sleep apnea, surgery and certain medicines can increase your risk for breathing problems. Follow instructions from your health care provider about wearing your sleep device: ? Anytime you are sleeping, including during daytime naps. ? While taking prescription pain medicines, sleeping medicines, or medicines that make you drowsy.  If you smoke, do not smoke without supervision.  Keep all follow-up visits as told by your health care provider. This is important. Contact a health care provider if:  You keep feeling nauseous or you keep vomiting.  You feel light-headed.  You develop a rash.  You have a fever. Get help right away if:  You have trouble breathing. Summary  For several hours after your procedure, you may feel sleepy and have poor judgment.  Have a responsible adult stay with you for at least 24 hours or until you are awake and alert. This information is not  intended to replace advice given to you by your health care provider. Make sure you discuss any questions you have with your health care provider. Document Revised: 08/09/2017 Document Reviewed: 09/01/2015 Elsevier Patient Education  2020 [employer].

## 2019-06-01 ENCOUNTER — Other Ambulatory Visit: Payer: Self-pay

## 2019-06-01 ENCOUNTER — Telehealth: Payer: Self-pay | Admitting: *Deleted

## 2019-06-01 ENCOUNTER — Encounter (HOSPITAL_COMMUNITY): Payer: Self-pay

## 2019-06-01 ENCOUNTER — Encounter (HOSPITAL_COMMUNITY)
Admission: RE | Admit: 2019-06-01 | Discharge: 2019-06-01 | Disposition: A | Discharge status: Home / Self Care | Payer: Self-pay | Source: Ambulatory Visit | Attending: Internal Medicine | Admitting: Internal Medicine

## 2019-06-01 ENCOUNTER — Other Ambulatory Visit (HOSPITAL_COMMUNITY)
Admission: RE | Admit: 2019-06-01 | Discharge: 2019-06-01 | Disposition: A | Discharge status: Home / Self Care | Payer: Self-pay | Source: Ambulatory Visit | Attending: Internal Medicine | Admitting: Internal Medicine

## 2019-06-01 DIAGNOSIS — I8500 Esophageal varices without bleeding: Secondary | ICD-10-CM | POA: Insufficient documentation

## 2019-06-01 DIAGNOSIS — K219 Gastro-esophageal reflux disease without esophagitis: Secondary | ICD-10-CM | POA: Insufficient documentation

## 2019-06-01 DIAGNOSIS — Z01812 Encounter for preprocedural laboratory examination: Secondary | ICD-10-CM | POA: Insufficient documentation

## 2019-06-01 DIAGNOSIS — Z20822 Contact with and (suspected) exposure to covid-19: Secondary | ICD-10-CM | POA: Insufficient documentation

## 2019-06-01 DIAGNOSIS — K3189 Other diseases of stomach and duodenum: Secondary | ICD-10-CM | POA: Insufficient documentation

## 2019-06-01 DIAGNOSIS — K449 Diaphragmatic hernia without obstruction or gangrene: Secondary | ICD-10-CM | POA: Insufficient documentation

## 2019-06-01 DIAGNOSIS — K766 Portal hypertension: Secondary | ICD-10-CM | POA: Insufficient documentation

## 2019-06-01 LAB — HEPATIC FUNCTION PANEL
ALT: 31 U/L (ref 0–44)
AST: 73 U/L — ABNORMAL HIGH (ref 15–41)
Albumin: 3.6 g/dL (ref 3.5–5.0)
Alkaline Phosphatase: 144 U/L — ABNORMAL HIGH (ref 38–126)
Bilirubin, Direct: 0.3 mg/dL — ABNORMAL HIGH (ref 0.0–0.2)
Indirect Bilirubin: 0.7 mg/dL (ref 0.3–0.9)
Total Bilirubin: 1 mg/dL (ref 0.3–1.2)
Total Protein: 8.9 g/dL — ABNORMAL HIGH (ref 6.5–8.1)

## 2019-06-01 LAB — SARS CORONAVIRUS 2 (TAT 6-24 HRS): SARS Coronavirus 2: NEGATIVE

## 2019-06-01 LAB — PROTIME-INR
INR: 1.1 (ref 0.8–1.2)
Prothrombin Time: 13.9 seconds (ref 11.4–15.2)

## 2019-06-01 NOTE — Telephone Encounter (Signed)
Called pt. He did not want to move to morning time for procedure. Stated that too early for him. endo aware

## 2019-06-05 ENCOUNTER — Ambulatory Visit (HOSPITAL_COMMUNITY): Payer: Self-pay | Admitting: Anesthesiology

## 2019-06-05 ENCOUNTER — Telehealth: Payer: Self-pay | Admitting: *Deleted

## 2019-06-05 ENCOUNTER — Other Ambulatory Visit: Payer: Self-pay

## 2019-06-05 ENCOUNTER — Encounter (HOSPITAL_COMMUNITY): Payer: Self-pay | Admitting: Internal Medicine

## 2019-06-05 ENCOUNTER — Encounter (HOSPITAL_COMMUNITY)
Admission: RE | Disposition: A | Discharge status: Home / Self Care | Payer: Self-pay | Source: Home / Self Care | Attending: Internal Medicine

## 2019-06-05 ENCOUNTER — Ambulatory Visit (HOSPITAL_COMMUNITY)
Admission: RE | Admit: 2019-06-05 | Discharge: 2019-06-05 | Disposition: A | Discharge status: Home / Self Care | Payer: Self-pay | Attending: Internal Medicine | Admitting: Internal Medicine

## 2019-06-05 DIAGNOSIS — I10 Essential (primary) hypertension: Secondary | ICD-10-CM | POA: Insufficient documentation

## 2019-06-05 DIAGNOSIS — I8510 Secondary esophageal varices without bleeding: Secondary | ICD-10-CM | POA: Insufficient documentation

## 2019-06-05 DIAGNOSIS — Z79899 Other long term (current) drug therapy: Secondary | ICD-10-CM | POA: Insufficient documentation

## 2019-06-05 DIAGNOSIS — Z09 Encounter for follow-up examination after completed treatment for conditions other than malignant neoplasm: Secondary | ICD-10-CM | POA: Insufficient documentation

## 2019-06-05 DIAGNOSIS — K449 Diaphragmatic hernia without obstruction or gangrene: Secondary | ICD-10-CM | POA: Insufficient documentation

## 2019-06-05 DIAGNOSIS — K3189 Other diseases of stomach and duodenum: Secondary | ICD-10-CM | POA: Insufficient documentation

## 2019-06-05 DIAGNOSIS — Z87891 Personal history of nicotine dependence: Secondary | ICD-10-CM | POA: Insufficient documentation

## 2019-06-05 DIAGNOSIS — K2100 Gastro-esophageal reflux disease with esophagitis, without bleeding: Secondary | ICD-10-CM | POA: Insufficient documentation

## 2019-06-05 DIAGNOSIS — K766 Portal hypertension: Secondary | ICD-10-CM | POA: Insufficient documentation

## 2019-06-05 DIAGNOSIS — K7030 Alcoholic cirrhosis of liver without ascites: Secondary | ICD-10-CM | POA: Insufficient documentation

## 2019-06-05 HISTORY — PX: ESOPHAGEAL BANDING: SHX5518

## 2019-06-05 HISTORY — PX: ESOPHAGOGASTRODUODENOSCOPY (EGD) WITH PROPOFOL: SHX5813

## 2019-06-05 SURGERY — ESOPHAGOGASTRODUODENOSCOPY (EGD) WITH PROPOFOL
Anesthesia: General

## 2019-06-05 MED ORDER — PROMETHAZINE HCL 25 MG/ML IJ SOLN: 6.2500 mg | INTRAMUSCULAR | Status: DC | PRN

## 2019-06-05 MED ORDER — KETAMINE HCL 50 MG/5ML IJ SOSY
PREFILLED_SYRINGE | INTRAMUSCULAR | Status: AC
  Filled 2019-06-05: qty 5

## 2019-06-05 MED ORDER — MIDAZOLAM HCL 2 MG/2ML IJ SOLN: 0.5000 mg | Freq: Once | INTRAMUSCULAR | Status: DC | PRN

## 2019-06-05 MED ORDER — HYDROMORPHONE HCL 1 MG/ML IJ SOLN: 0.2500 mg | INTRAMUSCULAR | Status: DC | PRN

## 2019-06-05 MED ORDER — PROPOFOL 10 MG/ML IV BOLUS
INTRAVENOUS | Status: DC | PRN
  Administered 2019-06-05: 60 mg via INTRAVENOUS
  Administered 2019-06-05: 20 mg via INTRAVENOUS

## 2019-06-05 MED ORDER — LIDOCAINE HCL (CARDIAC) PF 50 MG/5ML IV SOSY
PREFILLED_SYRINGE | INTRAVENOUS | Status: DC | PRN
  Administered 2019-06-05: 50 mg via INTRAVENOUS

## 2019-06-05 MED ORDER — LACTATED RINGERS IV SOLN: INTRAVENOUS | Status: DC

## 2019-06-05 MED ORDER — PROPOFOL 500 MG/50ML IV EMUL
INTRAVENOUS | Status: DC | PRN
  Administered 2019-06-05 (×2): 200 ug/kg/min via INTRAVENOUS

## 2019-06-05 MED ORDER — HYDROCODONE-ACETAMINOPHEN 7.5-325 MG PO TABS: 1.0000 | ORAL_TABLET | Freq: Once | ORAL | Status: DC | PRN

## 2019-06-05 MED ORDER — KETAMINE HCL 10 MG/ML IJ SOLN
INTRAMUSCULAR | Status: DC | PRN
  Administered 2019-06-05 (×2): 10 mg via INTRAVENOUS
  Administered 2019-06-05: 20 mg via INTRAVENOUS

## 2019-06-05 NOTE — Op Note (Signed)
[facility] Patient Name: Cody [name] Procedure Date: 06/05/2019 12:18 PM MRN: [id number] Date of Birth: Jun 30, 1970 Attending MD: Gennette Pac , MD CSN: [id number] Age: 49 Admit Type: Outpatient Procedure:                Upper GI endoscopy Indications:              Surveillance procedure Providers:                Gennette Pac, MD, Criselda Peaches. Patsy Lager, RN,                            Pandora Leiter, Technician Referring MD:              Medicines:                Propofol per Anesthesia Complications:            No immediate complications. Estimated Blood Loss:     Estimated blood loss: none. Procedure:                Pre-Anesthesia Assessment:                           - Prior to the procedure, a History and Physical                            was performed, and patient medications and                            allergies were reviewed. The patient's tolerance of                            previous anesthesia was also reviewed. The risks                            and benefits of the procedure and the sedation                            options and risks were discussed with the patient.                            All questions were answered, and informed consent                            was obtained. Prior Anticoagulants: The patient has                            taken no previous anticoagulant or antiplatelet                            agents. ASA Grade Assessment: III - A patient with                            severe systemic disease. After reviewing the risks  and benefits, the patient was deemed in                            satisfactory condition to undergo the procedure.                           After obtaining informed consent, the endoscope was                            passed under direct vision. Throughout the                            procedure, the patient's blood pressure, pulse, and                            oxygen  saturations were monitored continuously. The                            GIF-[id number] ([id number]) was introduced through the                            mouth, and advanced to the second part of duodenum. Scope In: 12:34:52 PM Scope Out: 12:47:10 PM Total Procedure Duration: 0 hours 12 minutes 18 seconds  Findings:      Esophagitis was found. Patient had eroded ulcerated mucosa straddling       the GE junction linear erosions extending up by 5 cm into the tubular       esophagus. No Barrett's epithelium seen. Patient had 3 columns of grade       2 esophageal varices?"2 or relatively short at 5 cm the third 1 was       longer at approximately 10 cm in length. No bleeding stigmata. Scope       removed and 7 shot bander unit applied scope reintroduced into the       esophagus. 5 bands placed on all 3 columns. Done if effectively without       apparent complication. No bleeding.      A small hiatal hernia was present.      Mild portal hypertensive gastropathy was found in the entire examined       stomach.      The duodenal bulb and second portion of the duodenum were normal. Impression:               -Moderately severe erosive/ulcerative reflux                            esophagitis. Esophageal varices as described.                            Status Post esophageal band ligation                           - Small hiatal hernia.                           - Portal hypertensive gastropathy.                           -  Normal duodenal bulb and second portion of the                            duodenum.                           - No specimens collected. Moderate Sedation:      Moderate (conscious) sedation was personally administered by an       anesthesia professional. The following parameters were monitored: oxygen       saturation, heart rate, blood pressure, respiratory rate, EKG, adequacy       of pulmonary ventilation, and response to care. Recommendation:           - Patient has a contact  number available for                            emergencies. The signs and symptoms of potential                            delayed complications were discussed with the                            patient. Return to normal activities tomorrow.                            Written discharge instructions were provided to the                            patient.                           - Resume previous diet.                           - Continue present medications. Increase Protonix                            to 40 mg twice daily                           - Repeat upper endoscopy (date not yet determined)                            for surveillance.                           - Return to GI clinic (date not yet determined).                            Very important for patient to keep his appointment                            tomorrow morning for MRI of liver. He has                            suspicious liver lesion seen on CT  previously. Procedure Code(s):        --- Professional ---                           [phone], Esophagogastroduodenoscopy, flexible,                            transoral; diagnostic, including collection of                            specimen(s) by brushing or washing, when performed                            (separate procedure) Diagnosis Code(s):        --- Professional ---                           K20.90, Esophagitis, unspecified without bleeding                           K44.9, Diaphragmatic hernia without obstruction or                            gangrene                           K76.6, Portal hypertension                           K31.89, Other diseases of stomach and duodenum CPT copyright 2019 [employer]. All rights reserved. The codes documented in this report are preliminary and upon coder review may  be revised to meet current compliance requirements. [name]. [name], MD Gennette Pac, MD 06/05/2019 1:08:37 PM This report has  been signed electronically. Number of Addenda: 0

## 2019-06-05 NOTE — Transfer of Care (Signed)
Immediate Anesthesia Transfer of Care Note  Patient: Cody [name]  Procedure(s) Performed: ESOPHAGOGASTRODUODENOSCOPY (EGD) WITH PROPOFOL (N/A ) ESOPHAGEAL BANDING (N/A )  Patient Location: PACU  Anesthesia Type:General  Level of Consciousness: awake and patient cooperative  Airway & Oxygen Therapy: Patient Spontanous Breathing  Post-op Assessment: Report given to RN and Post -op Vital signs reviewed and stable  Post vital signs: Reviewed and stable  Last Vitals:  Vitals Value Taken Time  BP 141/91 06/05/19 1253  Temp    Pulse 115 06/05/19 1254  Resp 24 06/05/19 1254  SpO2 97 % 06/05/19 1254  Vitals shown include unvalidated device data.  Last Pain:  Vitals:   06/05/19 1230  TempSrc:   PainSc: 0-No pain         Complications: No apparent anesthesia complications

## 2019-06-05 NOTE — Discharge Instructions (Signed)
EGD Discharge instructions Please read the instructions outlined below and refer to this sheet in the next few weeks. These discharge instructions provide you with general information on caring for yourself after you leave the hospital. Your doctor may also give you specific instructions. While your treatment has been planned according to the most current medical practices available, unavoidable complications occasionally occur. If you have any problems or questions after discharge, please call your doctor. ACTIVITY  You may resume your regular activity but move at a slower pace for the next 24 hours.   Take frequent rest periods for the next 24 hours.   Walking will help expel (get rid of) the air and reduce the bloated feeling in your abdomen.   No driving for 24 hours (because of the anesthesia (medicine) used during the test).   You may shower.   Do not sign any important legal documents or operate any machinery for 24 hours (because of the anesthesia used during the test).  NUTRITION  Drink plenty of fluids.   You may resume your normal diet.   Begin with a light meal and progress to your normal diet.   Avoid alcoholic beverages for 24 hours or as instructed by your caregiver.  MEDICATIONS  You may resume your normal medications unless your caregiver tells you otherwise.  WHAT YOU CAN EXPECT TODAY  You may experience abdominal discomfort such as a feeling of fullness or "gas" pains.  FOLLOW-UP  Your doctor will discuss the results of your test with you.  SEEK IMMEDIATE MEDICAL ATTENTION IF ANY OF THE FOLLOWING OCCUR:  Excessive nausea (feeling sick to your stomach) and/or vomiting.   Severe abdominal pain and distention (swelling).   Trouble swallowing.   Temperature over 101 F (37.8 C).  Rectal bleeding or vomiting of blood.     GERD information provided.  Increase Protonix to 40 mg twice daily  Soft diet today; slowly advance her diet tomorrow  Use  Chloraseptic spray for any transient sore throat/chest pain you may experience over the next couple days\  Keep appointment for MRI of the liver tomorrow morning at 930  At patient request, I called mother, Denvil Canning, [phone] and reviewed results   Gastroesophageal Reflux Disease, Adult Gastroesophageal reflux (GER) happens when acid from the stomach flows up into the tube that connects the mouth and the stomach (esophagus). Normally, food travels down the esophagus and stays in the stomach to be digested. With GER, food and stomach acid sometimes move back up into the esophagus. You may have a disease called gastroesophageal reflux disease (GERD) if the reflux:  Happens often.  Causes frequent or very bad symptoms.  Causes problems such as damage to the esophagus. When this happens, the esophagus becomes sore and swollen (inflamed). Over time, GERD can make small holes (ulcers) in the lining of the esophagus. What are the causes? This condition is caused by a problem with the muscle between the esophagus and the stomach. When this muscle is weak or not normal, it does not close properly to keep food and acid from coming back up from the stomach. The muscle can be weak because of:  Tobacco use.  Pregnancy.  Having a certain type of hernia (hiatal hernia).  Alcohol use.  Certain foods and drinks, such as coffee, chocolate, onions, and peppermint. What increases the risk? You are more likely to develop this condition if you:  Are overweight.  Have a disease that affects your connective tissue.  Use NSAID medicines. What  are the signs or symptoms? Symptoms of this condition include:  Heartburn.  Difficult or painful swallowing.  The feeling of having a lump in the throat.  A bitter taste in the mouth.  Bad breath.  Having a lot of saliva.  Having an upset or bloated stomach.  Belching.  Chest pain. Different conditions can cause chest pain. Make sure you  see your doctor if you have chest pain.  Shortness of breath or noisy breathing (wheezing).  Ongoing (chronic) cough or a cough at night.  Wearing away of the surface of teeth (tooth enamel).  Weight loss. How is this treated? Treatment will depend on how bad your symptoms are. Your doctor may suggest:  Changes to your diet.  Medicine.  Surgery. Follow these instructions at home: Eating and drinking   Follow a diet as told by your doctor. You may need to avoid foods and drinks such as: ? Coffee and tea (with or without caffeine). ? Drinks that contain alcohol. ? Energy drinks and sports drinks. ? Bubbly (carbonated) drinks or sodas. ? Chocolate and cocoa. ? Peppermint and mint flavorings. ? Garlic and onions. ? Horseradish. ? Spicy and acidic foods. These include peppers, chili powder, curry powder, vinegar, hot sauces, and BBQ sauce. ? Citrus fruit juices and citrus fruits, such as oranges, lemons, and limes. ? Tomato-based foods. These include red sauce, chili, salsa, and pizza with red sauce. ? Fried and fatty foods. These include donuts, french fries, potato chips, and high-fat dressings. ? High-fat meats. These include hot dogs, rib eye steak, sausage, ham, and bacon. ? High-fat dairy items, such as whole milk, butter, and cream cheese.  Eat small meals often. Avoid eating large meals.  Avoid drinking large amounts of liquid with your meals.  Avoid eating meals during the 2-3 hours before bedtime.  Avoid lying down right after you eat.  Do not exercise right after you eat. Lifestyle   Do not use any products that contain nicotine or tobacco. These include cigarettes, e-cigarettes, and chewing tobacco. If you need help quitting, ask your doctor.  Try to lower your stress. If you need help doing this, ask your doctor.  If you are overweight, lose an amount of weight that is healthy for you. Ask your doctor about a safe weight loss goal. General  instructions  Pay attention to any changes in your symptoms.  Take over-the-counter and prescription medicines only as told by your doctor. Do not take aspirin, ibuprofen, or other NSAIDs unless your doctor says it is okay.  Wear loose clothes. Do not wear anything tight around your waist.  Raise (elevate) the head of your bed about 6 inches (15 cm).  Avoid bending over if this makes your symptoms worse.  Keep all follow-up visits as told by your doctor. This is important. Contact a doctor if:  You have new symptoms.  You lose weight and you do not know why.  You have trouble swallowing or it hurts to swallow.  You have wheezing or a cough that keeps happening.  Your symptoms do not get better with treatment.  You have a hoarse voice. Get help right away if:  You have pain in your arms, neck, jaw, teeth, or back.  You feel sweaty, dizzy, or light-headed.  You have chest pain or shortness of breath.  You throw up (vomit) and your throw-up looks like blood or coffee grounds.  You pass out (faint).  Your poop (stool) is bloody or black.  You cannot swallow,  drink, or eat. Summary  If a person has gastroesophageal reflux disease (GERD), food and stomach acid move back up into the esophagus and cause symptoms or problems such as damage to the esophagus.  Treatment will depend on how bad your symptoms are.  Follow a diet as told by your doctor.  Take all medicines only as told by your doctor. This information is not intended to replace advice given to you by your health care provider. Make sure you discuss any questions you have with your health care provider. Document Revised: 11/17/2017 Document Reviewed: 11/17/2017 Elsevier Patient Education  2020 [employer]   Esophageal Varices  Esophageal varices are enlarged veins in the part of the body that moves food from the mouth to the stomach (esophagus). They develop when extra blood is forced to flow through these  veins because the blood's normal pathway is blocked. Without treatment, esophageal varices eventually break and bleed (hemorrhage), which can be life-threatening. What are the causes? This condition may be caused by:  Scarring of the liver (cirrhosis) due to alcoholism. This is the most common cause.  Long-term (chronic) liver disease.  Severe heart failure.  A blood clot in a vein that supplies the liver (portal vein).  A disease that causes inflammation in the organs and other body areas (sarcoidosis).  A parasitic infection that can cause liver damage (schistosomiasis). What are the signs or symptoms? Esophageal varices usually do not cause symptoms unless they start to bleed. Symptoms of bleeding esophageal varices include:  Vomiting material that is bright red or that is black and looks like coffee grounds.  Coughing up blood.  Stools (feces) that look black and tarry.  Dizziness or light-headedness.  Low blood pressure.  Loss of consciousness. How is this diagnosed? This condition is diagnosed with a procedure called endoscopy. During endoscopy, your health care provider uses a flexible tube with a small camera on the end of it (endoscope) to look down your throat and examine your esophagus. You may also have other tests, including:  Imaging tests such as a CT scan or ultrasound.  Blood tests. How is this treated? This condition may be treated with medicines or procedures that reduce pressure in the varices and reduce the risk of bleeding. Medicines are usually used for varices that are not bleeding. Procedures that may be done for bleeding varices include:  Placing an elastic band around the varices to keep them from bleeding (variceal ligation).  Replacing blood that you have lost due to bleeding. This may include getting a transfusion of blood or parts of blood, such as platelets or clotting factors.  You may be given antibiotic medicine to help prevent  infection.  Getting an injection that causes the varices to shrink and close (sclerotherapy). You may also be given medicines that tighten (constrict) blood vessels or change blood flow.  Placing a tube into your esophagus and then passing a balloon through the tube and inflating the balloon (balloon tamponade). The balloon applies pressure to the bleeding veins to help stop the bleeding.  Placing a small tube within the veins in the liver (transjugular intrahepatic portosystemic shunt, TIPS). This decreases blood flow and pressure in the esophageal varices. If other treatments do not work, you may need a liver transplant. Follow these instructions at home:  Take over-the-counter and prescription medicines only as told by your health care provider.  If you were prescribed an antibiotic medicine, take it as told by your health care provider. Do not stop taking  the antibiotic even if you start to feel better.  Do not take any NSAIDs (such as aspirin or ibuprofen) before first getting approval from your health care provider.  Do not drink alcohol.  Return to your normal activities as told by your health care provider. Ask your health care provider what activities are safe for you.  Keep all follow-up visits as told by your health care provider. This is important. Contact a health care provider if:  You have abdominal pain.  You are unable to eat or drink. Get help right away if:  You have blood in your stool or vomit.  You have stools that look black or tarry.  You have chest pain.  You feel dizzy or have low blood pressure.  You lose consciousness. These symptoms may represent a serious problem that is an emergency. Do not wait to see if the symptoms will go away. Get medical help right away. Call your local emergency services (911 in the [place] Do not drive yourself to the hospital. Summary  Esophageal varices are enlarged veins in the esophagus, the part of your body that  moves food from your mouth to your stomach.  Without treatment, esophageal varices eventually break and bleed (hemorrhage), which can be life-threatening.  Esophageal varices usually do not cause symptoms unless they start to bleed.  Keep all follow-up visits as told by your health care provider. This is important. This information is not intended to replace advice given to you by your health care provider. Make sure you discuss any questions you have with your health care provider. Document Revised: 04/23/2017 Document Reviewed: 02/10/2017 Elsevier Patient Education  2020 [employer]   Esophageal Variceal Ligation  Esophageal varices are large, swollen veins in the tube that connects your throat to your stomach (esophagus). They are caused by problems in the liver. These veins are weak and bleed easily. To treat this condition, the doctor will use a scope (endoscope) to look for the swollen veins. He or she will then put stretchy bands around the veins. This will cut off their blood supply. Over time, the veins will get smaller. This is called esophageal variceal ligation (EVL). EVL is often done again in 1 to 2 weeks. It may have to be done 3 or 4 times to treat all the swollen veins. Tell your doctor about:  Any allergies you have.  All medicines you are taking.  Any problems you or family members have had with anesthetic medicines.  Any blood disorders you have.  Any surgeries you have had.  Any medical conditions you have.  Whether you are pregnant or may be pregnant. What are the risks? Generally, this is a safe procedure. But, problems may occur, such as:  Bleeding.  Infection.  Sores (ulcers) where the bands were placed.  A hole in the esophagus.  Chest pain that lasts a short time.  Swallowing problems.  Scarring of the esophagus.  Narrowing of the esophagus. What happens before the procedure? Staying hydrated Follow instructions from your doctor about  hydration. These may include:  Up to 2 hours before the procedure - you may continue to drink clear liquids. These include water, clear fruit juice, black coffee, and plain tea. Eating and drinking restrictions Follow instructions from your doctor about eating and drinking. These may include:  8 hours before the procedure - stop eating heavy meals or foods. These include meat, fried foods, or fatty foods.  6 hours before the procedure - stop eating light meals  or foods. These include toast or cereal.  6 hours before the procedure - stop drinking milk or drinks that contain milk.  2 hours before the procedure - stop drinking clear liquids. Medicines Ask your doctor about:  Changing or stopping your normal medicines. This is important.  Taking aspirin and ibuprofen. Do not take these medicines unless your doctor tells you to take them.  Taking over-the-counter medicines, vitamins, herbs, and supplements. General information  A scope will be used to look at your swollen veins.  You may need a test to find out your blood type. This is in case you need to be given blood.  Plan to have someone take you home from the hospital or clinic.  Plan to have an adult care for you for at least 24 hours after you go home. This is important.  Ask your doctor what steps will be taken to stop infection. You may need to take antibiotic medicine. What happens during the procedure?  An IV tube will be put into one of your veins.  You will be given one or more of the following: ? A medicine to help you relax (sedative). ? A medicine to make you fall asleep (general anesthetic).  When you are asleep or relaxed, your doctor will place the scope through your mouth, down your throat, and into your esophagus.  Your doctor will see the swollen veins through the scope.  Your doctor will use a tool to band the veins.  The scope will be taken out when all the veins have been banded. The procedure may  vary among doctors and hospitals. What happens after the procedure?  Your blood pressure, heart rate, breathing rate, and blood oxygen level will be monitored until you are ready to go home.  If you do not have bleeding, you may be able to go home soon after the procedure.  Do not drive for 24 hours if you were given a medicine to make you sleep.  You may be given a medicine to keep you from bleeding (beta-blocker). Summary  EVL is a procedure that is used to shrink swollen veins in your esophagus.  Stretchy bands are placed around the veins. This will cut off their blood supply.  The doctor will tell you the things that you need to do before the procedure.  If you do not have bleeding, you may be able to go home soon after the procedure. This information is not intended to replace advice given to you by your health care provider. Make sure you discuss any questions you have with your health care provider. Document Revised: 08/17/2017 Document Reviewed: 08/17/2017 Elsevier Patient Education  2020 [employer]   Esophageal Variceal Ligation, Care After This sheet gives you information about how to care for yourself after your procedure. Your doctor may also give you more specific instructions. If you have problems or questions, contact your doctor. What can I expect after the procedure? After the procedure, it is common to have:  Bleeding.  Pain and soreness in your chest area.  Trouble swallowing. Follow these instructions at home:  Eating and drinking Follow instructions from your doctor about what you can eat or drink.  You will have limits on what you can eat for the first 1-2 days after your procedure.  You will start with a liquid diet. Later, you will start to eat soft foods.  Do not drink alcohol.  Activity  Return to your normal activities as told by your doctor. Ask  your doctor what activities are safe for you.  Do not lift anything that is heavier than 10  lb (4.5 kg), or the limit that you are told, until your doctor says that it is safe.  Do not drive or use heavy machinery while taking prescription pain medicine. General instructions  Take over-the-counter and prescription medicines only as told by your doctor.  Do not use any products that contain nicotine or tobacco, such as cigarettes and e-cigarettes. If you need help quitting, ask your doctor.  Keep all follow-up visits as told by your doctor. This is important. Contact a doctor if:  You have chest pain that lasts for more than 3 days after you go home.  You have trouble swallowing that lasts for more than 3 days after you go home.  You have a fever or chills. Get help right away if:  You have bleeding from your throat.  You have bleeding from your bottom (rectum).  You throw up (vomit) bright red blood.  You are unable to swallow.  You are short of breath.  You have very bad chest pain or back pain. Summary  After the procedure, it is common to have pain, bleeding, and trouble swallowing.  Follow all your home care instructions.  Stay on a liquid or soft diet until your doctor says that you can go back to your normal diet.  Contact a doctor if you have chills, fever, chest pain, or trouble swallowing.  Get help right away if you have bleeding, are unable to swallow, or have very bad chest or back pain. This information is not intended to replace advice given to you by your health care provider. Make sure you discuss any questions you have with your health care provider. Document Revised: 08/17/2017 Document Reviewed: 08/17/2017 Elsevier Patient Education  2020 [employer]      Monitored Anesthesia Care Anesthesia is a term that refers to techniques, procedures, and medicines that help a person stay safe and comfortable during a medical procedure. Monitored anesthesia care, or sedation, is one type of anesthesia. Your anesthesia specialist may recommend  sedation if you will be having a procedure that does not require you to be unconscious, such as:  Cataract surgery.  A dental procedure.  A biopsy.  A colonoscopy. During the procedure, you may receive a medicine to help you relax (sedative). There are three levels of sedation:  Mild sedation. At this level, you may feel awake and relaxed. You will be able to follow directions.  Moderate sedation. At this level, you will be sleepy. You may not remember the procedure.  Deep sedation. At this level, you will be asleep. You will not remember the procedure. The more medicine you are given, the deeper your level of sedation will be. Depending on how you respond to the procedure, the anesthesia specialist may change your level of sedation or the type of anesthesia to fit your needs. An anesthesia specialist will monitor you closely during the procedure. Let your health care provider know about:  Any allergies you have.  All medicines you are taking, including vitamins, herbs, eye drops, creams, and over-the-counter medicines.  Any use of steroids (by mouth or as a cream).  Any problems you or family members have had with sedatives and anesthetic medicines.  Any blood disorders you have.  Any surgeries you have had.  Any medical conditions you have, such as sleep apnea.  Whether you are pregnant or may be pregnant.  Any use of cigarettes,  alcohol, or street drugs. What are the risks? Generally, this is a safe procedure. However, problems may occur, including:  Getting too much medicine (oversedation).  Nausea.  Allergic reaction to medicines.  Trouble breathing. If this happens, a breathing tube may be used to help with breathing. It will be removed when you are awake and breathing on your own.  Heart trouble.  Lung trouble. Before the procedure Staying hydrated Follow instructions from your health care provider about hydration, which may include:  Up to 2 hours before  the procedure - you may continue to drink clear liquids, such as water, clear fruit juice, black coffee, and plain tea. Eating and drinking restrictions Follow instructions from your health care provider about eating and drinking, which may include:  8 hours before the procedure - stop eating heavy meals or foods such as meat, fried foods, or fatty foods.  6 hours before the procedure - stop eating light meals or foods, such as toast or cereal.  6 hours before the procedure - stop drinking milk or drinks that contain milk.  2 hours before the procedure - stop drinking clear liquids. Medicines Ask your health care provider about:  Changing or stopping your regular medicines. This is especially important if you are taking diabetes medicines or blood thinners.  Taking medicines such as aspirin and ibuprofen. These medicines can thin your blood. Do not take these medicines before your procedure if your health care provider instructs you not to. Tests and exams  You will have a physical exam.  You may have blood tests done to show: ? How well your kidneys and liver are working. ? How well your blood can clot. General instructions  Plan to have someone take you home from the hospital or clinic.  If you will be going home right after the procedure, plan to have someone with you for 24 hours.  What happens during the procedure?  Your blood pressure, heart rate, breathing, level of pain and overall condition will be monitored.  An IV tube will be inserted into one of your veins.  Your anesthesia specialist will give you medicines as needed to keep you comfortable during the procedure. This may mean changing the level of sedation.  The procedure will be performed. After the procedure  Your blood pressure, heart rate, breathing rate, and blood oxygen level will be monitored until the medicines you were given have worn off.  Do not drive for 24 hours if you received a sedative.  You  may: ? Feel sleepy, clumsy, or nauseous. ? Feel forgetful about what happened after the procedure. ? Have a sore throat if you had a breathing tube during the procedure. ? Vomit. This information is not intended to replace advice given to you by your health care provider. Make sure you discuss any questions you have with your health care provider. Document Revised: 04/23/2017 Document Reviewed: 09/01/2015 Elsevier Patient Education  2020 [employer].

## 2019-06-05 NOTE — Telephone Encounter (Signed)
Spoke with pt. Pt thought he may has some mild irritation s/p his EGD. Pt is aware that mild irritation is normal. Pt isn't having any soreness of his throat at this time and is aware that he can use chloraseptic spray if needed. Pt will call back tomorrow if his symptoms worsen and pt knows to go to the EG if he has any complications from his procedure after hours.

## 2019-06-05 NOTE — H&P (Signed)
@LOGO @   Primary Care Physician:  [facility] Primary Gastroenterologist:  [facility]. [facility]  Pre-Procedure History & Physical: HPI:  Cody [name] is a 49 y.o. male here for   Past Medical History:  Diagnosis Date  . Hypertension     Past Surgical History:  Procedure Laterality Date  . ESOPHAGOGASTRODUODENOSCOPY  02/10/2007   Dr. 02/12/2007; distal esophageal erosions consistent with reflux esophagitis, moderate sized hiatal hernia, submucosal gastric hemorrhage.  . ESOPHAGOGASTRODUODENOSCOPY (EGD) WITH PROPOFOL N/A 05/02/2019   Procedure: ESOPHAGOGASTRODUODENOSCOPY (EGD) WITH PROPOFOL;  Surgeon: 14/12/2018, MD;  [facility];  Service: Endoscopy;  Laterality: N/A;  . TONSILLECTOMY  childhood    Prior to Admission medications   Medication Sig Start Date End Date Taking? Authorizing Provider  nadolol (CORGARD) 20 MG tablet Take 1 tablet (20 mg total) by mouth daily. 05/05/19  Yes 14/11/20, MD  pantoprazole (PROTONIX) 40 MG tablet Take 1 tab by mouth twice a day for 1 month; than 1 tablet daily. 05/05/19  Yes 14/11/20, MD  Multiple Vitamin (MULTIVITAMIN WITH MINERALS) TABS tablet Take 1 tablet by mouth daily. 05/05/19   14/11/20, MD    Allergies as of 05/08/2019  . (No Known Allergies)    Family History  Problem Relation Age of Onset  . Colon cancer Neg Hx   . Stomach cancer Neg Hx   . Esophageal cancer Neg Hx     Social History   Socioeconomic History  . Marital status: Single    Spouse name: Not on file  . Number of children: Not on file  . Years of education: Not on file  . Highest education level: Not on file  Occupational History  . Not on file  Tobacco Use  . Smoking status: Former Smoker    Packs/day: 2.00    Years: 15.00    Pack years: 30.00    Types: Cigarettes    Quit date: 05/31/1998    Years since quitting: 21.0  . Smokeless tobacco: Never Used  Substance and Sexual Activity  . Alcohol use: Yes     Comment: stopped drinking December 2020.  January 2021 Drug use: No  . Sexual activity: Yes  Other Topics Concern  . Not on file  Social History Narrative  . Not on file   Social Determinants of Health   Financial Resource Strain:   . Difficulty of Paying Living Expenses: Not on file  Food Insecurity:   . Worried About Marland Kitchen in the Last Year: Not on file  . Ran Out of Food in the Last Year: Not on file  Transportation Needs:   . Lack of Transportation (Medical): Not on file  . Lack of Transportation (Non-Medical): Not on file  Physical Activity:   . Days of Exercise per Week: Not on file  . Minutes of Exercise per Session: Not on file  Stress:   . Feeling of Stress : Not on file  Social Connections:   . Frequency of Communication with Friends and Family: Not on file  . Frequency of Social Gatherings with Friends and Family: Not on file  . Attends Religious Services: Not on file  . Active Member of Clubs or Organizations: Not on file  . Attends Programme researcher, broadcasting/film/video Meetings: Not on file  . Marital Status: Not on file  Intimate Partner Violence:   . Fear of Current or Ex-Partner: Not on file  . Emotionally Abused: Not on file  . Physically Abused: Not on  file  . Sexually Abused: Not on file    Review of Systems: See HPI, otherwise negative ROS  Physical Exam: BP (!) 143/98   Pulse (!) 115   Temp 98.4 F (36.9 C) (Oral)   Resp 19   SpO2 97%  General:   Alert,  Well-developed, well-nourished, pleasant and cooperative in NAD Neck:  Supple; no masses or thyromegaly. No significant cervical adenopathy. Lungs:  Clear throughout to auscultation.   No wheezes, crackles, or rhonchi. No acute distress. Heart:  Regular rate and rhythm; no murmurs, clicks, rubs,  or gallops. Abdomen: Non-distended, normal bowel sounds.  Soft and nontender without appreciable mass or hepatosplenomegaly.  Pulses:  Normal pulses noted. Extremities:  Without clubbing or  edema.  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  Doing well.  On Corgard 20 mg daily.  Does not appear to be adequately beta blocked at this time.  Recommendations:  I have offered the patient a repeat EGD with esophageal band ligation as feasible/appropriate per plan  The risks, benefits, limitations, alternatives and imponderables have been reviewed with the patient. Potential for esophageal dilation, biopsy, etc. have also been reviewed.  Questions have been answered. All parties agreeable.      Notice: This dictation was prepared with Dragon dictation along with smaller phrase technology. Any transcriptional errors that result from this process are unintentional and may not be corrected upon review.

## 2019-06-05 NOTE — Telephone Encounter (Signed)
Pt said that he had EGD today.  Pt complaining of discomfort below his throat.  Pt says that that below his esophagus hurts sometimes when he takes a breath. Pt wants to know if he should be concerned.   [phone]

## 2019-06-05 NOTE — Anesthesia Postprocedure Evaluation (Signed)
Anesthesia Post Note  Patient: Cody [name]  Procedure(s) Performed: ESOPHAGOGASTRODUODENOSCOPY (EGD) WITH PROPOFOL (N/A ) ESOPHAGEAL BANDING (N/A )  Patient location during evaluation: PACU Anesthesia Type: General Level of consciousness: awake and alert and patient cooperative Pain management: satisfactory to patient Respiratory status: spontaneous breathing Cardiovascular status: stable Postop Assessment: no apparent nausea or vomiting Anesthetic complications: no     Last Vitals:  Vitals:   06/05/19 1116 06/05/19 1255  BP: (!) 143/98 (!) 141/91  Pulse: (!) 115 (!) 115  Resp: 19 (!) 24  Temp: 36.9 C 36.8 C  SpO2: 97% 96%    Last Pain:  Vitals:   06/05/19 1255  TempSrc:   PainSc: 0-No pain                 Earlisha Sharples

## 2019-06-05 NOTE — Anesthesia Preprocedure Evaluation (Signed)
Anesthesia Evaluation  Patient identified by MRN, date of birth, ID band Patient awake    Reviewed: Allergy & Precautions, NPO status , Patient's Chart, lab work & pertinent test results, reviewed documented beta blocker date and time   Airway Mallampati: I  TM Distance: >3 FB Neck ROM: Full    Dental no notable dental hx. (+) Missing, Loose, Poor Dentition   Pulmonary neg pulmonary ROS, former smoker,    Pulmonary exam normal breath sounds clear to auscultation       Cardiovascular Exercise Tolerance: Good hypertension, Pt. on medications and Pt. on home beta blockers negative cardio ROS Normal cardiovascular examI Rhythm:Regular Rate:Normal     Neuro/Psych Anxiety negative neurological ROS  negative psych ROS   GI/Hepatic GERD  Medicated and Controlled,(+) Cirrhosis     substance abuse  alcohol use,   Endo/Other  negative endocrine ROS  Renal/GU negative Renal ROS  negative genitourinary   Musculoskeletal negative musculoskeletal ROS (+)   Abdominal   Peds negative pediatric ROS (+)  Hematology negative hematology ROS (+)   Anesthesia Other Findings   Reproductive/Obstetrics negative OB ROS                             Anesthesia Physical Anesthesia Plan  ASA: III  Anesthesia Plan: General   Post-op Pain Management:    Induction: Intravenous  PONV Risk Score and Plan: 2 and TIVA, Propofol infusion and Treatment may vary due to age or medical condition  Airway Management Planned: Nasal Cannula and Simple Face Mask  Additional Equipment:   Intra-op Plan:   Post-operative Plan:   Informed Consent: I have reviewed the patients History and Physical, chart, labs and discussed the procedure including the risks, benefits and alternatives for the proposed anesthesia with the patient or authorized representative who has indicated his/her understanding and acceptance.      Dental advisory given  Plan Discussed with: CRNA  Anesthesia Plan Comments: (Plan Full PPE use  Plan GA with GETA as needed d/w pt -WTP with same after Q&A)        Anesthesia Quick Evaluation

## 2019-06-05 NOTE — Anesthesia Procedure Notes (Signed)
Date/Time: 06/05/2019 12:26 PM Performed by: Franco Nones, CRNA Pre-anesthesia Checklist: Patient identified, Emergency Drugs available, Suction available, Timeout performed and Patient being monitored Patient Re-evaluated:Patient Re-evaluated prior to induction Oxygen Delivery Method: Nasal Cannula

## 2019-06-06 ENCOUNTER — Other Ambulatory Visit: Payer: Self-pay

## 2019-06-06 ENCOUNTER — Ambulatory Visit (HOSPITAL_COMMUNITY)
Admission: RE | Admit: 2019-06-06 | Discharge: 2019-06-06 | Disposition: A | Discharge status: Home / Self Care | Payer: Self-pay | Source: Ambulatory Visit | Attending: Gastroenterology | Admitting: Gastroenterology

## 2019-06-06 DIAGNOSIS — R945 Abnormal results of liver function studies: Secondary | ICD-10-CM

## 2019-06-06 DIAGNOSIS — K769 Liver disease, unspecified: Secondary | ICD-10-CM | POA: Insufficient documentation

## 2019-06-06 DIAGNOSIS — R7989 Other specified abnormal findings of blood chemistry: Secondary | ICD-10-CM

## 2019-06-06 MED ORDER — GADOBUTROL 1 MMOL/ML IV SOLN
7.0000 mL | Freq: Once | INTRAVENOUS | Status: AC | PRN
  Administered 2019-06-06: 7 mL via INTRAVENOUS

## 2019-06-12 ENCOUNTER — Other Ambulatory Visit: Payer: Self-pay

## 2019-06-12 DIAGNOSIS — R945 Abnormal results of liver function studies: Secondary | ICD-10-CM

## 2019-06-16 ENCOUNTER — Other Ambulatory Visit: Payer: Self-pay

## 2019-06-16 ENCOUNTER — Other Ambulatory Visit (HOSPITAL_COMMUNITY)
Admission: RE | Admit: 2019-06-16 | Discharge: 2019-06-16 | Disposition: A | Discharge status: Home / Self Care | Payer: Self-pay | Source: Ambulatory Visit | Attending: Gastroenterology | Admitting: Gastroenterology

## 2019-06-16 DIAGNOSIS — R945 Abnormal results of liver function studies: Secondary | ICD-10-CM | POA: Insufficient documentation

## 2019-06-17 LAB — AFP TUMOR MARKER: AFP, Serum, Tumor Marker: 4.8 ng/mL (ref 0.0–8.3)

## 2019-06-22 ENCOUNTER — Other Ambulatory Visit: Payer: Self-pay

## 2019-06-22 DIAGNOSIS — K769 Liver disease, unspecified: Secondary | ICD-10-CM

## 2019-06-26 ENCOUNTER — Other Ambulatory Visit: Payer: Self-pay

## 2019-06-26 DIAGNOSIS — R945 Abnormal results of liver function studies: Secondary | ICD-10-CM

## 2019-06-26 DIAGNOSIS — R7989 Other specified abnormal findings of blood chemistry: Secondary | ICD-10-CM

## 2019-06-28 ENCOUNTER — Ambulatory Visit
Admission: RE | Admit: 2019-06-28 | Discharge: 2019-06-28 | Disposition: A | Discharge status: Home / Self Care | Payer: Self-pay | Source: Ambulatory Visit | Attending: Gastroenterology | Admitting: Gastroenterology

## 2019-06-28 ENCOUNTER — Encounter: Payer: Self-pay | Admitting: *Deleted

## 2019-06-28 DIAGNOSIS — K769 Liver disease, unspecified: Secondary | ICD-10-CM

## 2019-06-28 HISTORY — PX: IR RADIOLOGIST EVAL & MGMT: IMG5224

## 2019-06-28 NOTE — Consult Note (Signed)
Chief Complaint: Patient was consulted remotely today (TeleHealth) for liver lesion at the request of Lewis,Leslie S.    Referring Physician(s): Lewis,Leslie S  History of Present Illness: Cody [name] is a 49 y.o. male with a history of alcohol abuse, portal hypertension and esophageal varices who was admitted in December with a variceal bleed and had 3 variceal bands placed by Dr. Oneida Alar.  EGD showed 4 columns of grade 2 and grade 3 varices as well as mild portal hypertensive gastropathy in the proximal stomach.  He recently had repeat EGD on 06/05/2019 by Dr. Gala Romney with additional 5 bands placed on 3 columns of grade 2 varices.  Mild portal hypertensive gastropathy was found in the entire stomach.  Abdominal ultrasound on 05/03/2019 did not show any hepatic lesions and demonstrated increased echogenicity of the liver with probable cirrhosis.  CT of the abdomen was performed on 05/25/2019 demonstrating suggestion of ill-defined heterogeneous enhancement in the peripheral right lobe of the liver adjacent to a central cystic focus and possibly additional abnormal enhancement in the left lobe.  MRI was performed on 06/06/2019 demonstrating an ill-defined lesion in the peripheral right lobe spanning across segments 5 and 8 and demonstrating minimal arterial enhancement with progressive delayed enhancement.  Overall area of enhancement measured approximately 3.2 x 2.8 cm with 2 central cystic foci.  No other definite abnormal enhancement was seen throughout the liver.  There is no evidence of portal vein thrombus.  No lymphadenopathy identified.  The lesion was considered a LI-RADS 3 lesion by MRI.  The patient is currently asymptomatic.  He states that he has stopped drinking alcohol as of last Friday.  He denies any hematemesis, melena, nausea or vomiting.  He has no abdominal pain.  He has a good appetite and denies any significant weight loss.  He does not have a history of encephalopathy or  ascites.  Prior hepatitis work-up has been negative.  He is HIV negative.  AFP is normal.  Past Medical History:  Diagnosis Date  . Hypertension     Past Surgical History:  Procedure Laterality Date  . ESOPHAGEAL BANDING N/A 06/05/2019   Procedure: ESOPHAGEAL BANDING;  Surgeon: Daneil Dolin, MD;  [facility];  Service: Endoscopy;  Laterality: N/A;  . ESOPHAGOGASTRODUODENOSCOPY  02/10/2007   Dr. Gala Romney; distal esophageal erosions consistent with reflux esophagitis, moderate sized hiatal hernia, submucosal gastric hemorrhage.  . ESOPHAGOGASTRODUODENOSCOPY (EGD) WITH PROPOFOL N/A 05/02/2019   Procedure: ESOPHAGOGASTRODUODENOSCOPY (EGD) WITH PROPOFOL;  Surgeon: Danie Binder, MD;  [facility];  Service: Endoscopy;  Laterality: N/A;  . ESOPHAGOGASTRODUODENOSCOPY (EGD) WITH PROPOFOL N/A 06/05/2019   Procedure: ESOPHAGOGASTRODUODENOSCOPY (EGD) WITH PROPOFOL;  Surgeon: Daneil Dolin, MD;  [facility];  Service: Endoscopy;  Laterality: N/A;  2:45pm - pt can not come earlier  . TONSILLECTOMY  childhood    Allergies: Patient has no known allergies.  Medications: Prior to Admission medications   Medication Sig Start Date End Date Taking? Authorizing Provider  Multiple Vitamin (MULTIVITAMIN WITH MINERALS) TABS tablet Take 1 tablet by mouth daily. 05/05/19   Barton Dubois, MD  nadolol (CORGARD) 20 MG tablet Take 1 tablet (20 mg total) by mouth daily. 05/05/19   Barton Dubois, MD  pantoprazole (PROTONIX) 40 MG tablet Take 1 tab by mouth twice a day for 1 month; than 1 tablet daily. 05/05/19   Barton Dubois, MD     Family History  Problem Relation Age of Onset  . Colon cancer Neg Hx   .  Stomach cancer Neg Hx   . Esophageal cancer Neg Hx     Social History   Socioeconomic History  . Marital status: Single    Spouse name: Not on file  . Number of children: Not on file  . Years of education: Not on file  . Highest education level: Not on file    Occupational History  . Not on file  Tobacco Use  . Smoking status: Former Smoker    Packs/day: 2.00    Years: 15.00    Pack years: 30.00    Types: Cigarettes    Quit date: 05/31/1998    Years since quitting: 21.0  . Smokeless tobacco: Never Used  Substance and Sexual Activity  . Alcohol use: Yes    Comment: stopped drinking December 2020.  Marland Kitchen Drug use: No  . Sexual activity: Yes  Other Topics Concern  . Not on file  Social History Narrative  . Not on file   Social Determinants of Health   Financial Resource Strain:   . Difficulty of Paying Living Expenses: Not on file  Food Insecurity:   . Worried About Programme researcher, broadcasting/film/video in the Last Year: Not on file  . Ran Out of Food in the Last Year: Not on file  Transportation Needs:   . Lack of Transportation (Medical): Not on file  . Lack of Transportation (Non-Medical): Not on file  Physical Activity:   . Days of Exercise per Week: Not on file  . Minutes of Exercise per Session: Not on file  Stress:   . Feeling of Stress : Not on file  Social Connections:   . Frequency of Communication with Friends and Family: Not on file  . Frequency of Social Gatherings with Friends and Family: Not on file  . Attends Religious Services: Not on file  . Active Member of Clubs or Organizations: Not on file  . Attends Banker Meetings: Not on file  . Marital Status: Not on file     Review of Systems  Constitutional: Negative.   Respiratory: Negative.   Cardiovascular: Negative.   Gastrointestinal: Negative.   Genitourinary: Negative.   Musculoskeletal: Negative.   Neurological: Negative.     Review of Systems: A 12 point ROS discussed and pertinent positives are indicated in the HPI above.  All other systems are negative.  Physical Exam No direct physical exam was performed (except for noted visual exam findings with Video Visits).   Vital Signs: There were no vitals taken for this visit.  Imaging: MR Abdomen W Wo  Contrast  Result Date: 06/06/2019 CLINICAL DATA:  Cirrhosis.  Liver lesions on recent CT. EXAM: MRI ABDOMEN WITHOUT AND WITH CONTRAST TECHNIQUE: Multiplanar multisequence MR imaging of the abdomen was performed both before and after the administration of intravenous contrast. CONTRAST:  8mL GADAVIST GADOBUTROL 1 MMOL/ML IV SOLN COMPARISON:  CT on 05/25/2019 FINDINGS: Image degradation by motion artifact noted. Lower chest: No acute findings. Hepatobiliary: Gross morphologic changes of cirrhosis are again seen. Diffusely heterogeneous enhancement of the hepatic parenchyma is seen. A ill-defined mass is seen in in segment 8/5 which shows minimal arterial phase enhancement with progressive delayed phase hyperenhancement and mild T2 hyperintensity. This lesion also shows 2 central cystic foci and ill-defined margins, measuring 3.2 x 2.8 cm on image 43/25. A 2nd tiny sub-cm cystic focus is seen in lateral aspect of segment 5 which shows no definite associated solid/enhancing component. A few other tiny sub-cm cysts are seen in the right and left  lobes, but no other liver masses are identified. No evidence of portal or hepatic vein thrombosis. Gallbladder is unremarkable. No evidence of biliary ductal dilatation. Pancreas:  No mass or inflammatory changes. Spleen:  Within normal limits in size and appearance. Adrenals/Urinary Tract: No masses identified. No evidence of hydronephrosis. Stomach/Bowel: Visualized portion unremarkable. Vascular/Lymphatic: No pathologically enlarged lymph nodes identified. No abdominal aortic aneurysm. Other:  None. Musculoskeletal:  No suspicious bone lesions identified. IMPRESSION: Image degradation by motion artifact noted. Hepatic cirrhosis, with at least 1 mass in the right lobe measuring 3.2 cm, which has indeterminate characteristics. (LI-RADS Category 3: Intermediate probability of malignancy). Hepatocellular carcinoma cannot be excluded in the setting of cirrhosis. No evidence of  abdominal metastatic disease. Electronically Signed   By: Danae Orleans M.D.   On: 06/06/2019 12:23    Labs:  CBC: Recent Labs    05/02/19 0815 05/02/19 2144 05/03/19 0612 05/12/19 1114  WBC 9.1 11.8* 10.8* 7.8  HGB 12.9* 12.4* 10.8* 13.0  HCT 38.8* 37.3* 33.2* 39.6  PLT 220 209 162 366    COAGS: Recent Labs    05/03/19 0612 06/01/19 1418  INR 1.3* 1.1    BMP: Recent Labs    05/02/19 0815 05/03/19 0612  NA 141 139  K 3.9 3.7  CL 108 105  CO2 20* 24  GLUCOSE 176* 148*  BUN 17 15  CALCIUM 9.9 8.8*  CREATININE 0.86 1.04  GFRNONAA >60 >60  GFRAA >60 >60    LIVER FUNCTION TESTS: Recent Labs    05/03/19 0612 05/04/19 0530 05/12/19 1114 06/01/19 1418  BILITOT 2.2* 2.7* 1.5* 1.0  AST 56* 105* 113* 73*  ALT 22 26 69* 31  ALKPHOS 76 68 135* 144*  PROT 6.7 6.8 8.6* 8.9*  ALBUMIN 2.9* 2.8* 3.7 3.6    TUMOR MARKERS: AFP 4.8  Assessment and Plan:  I spoke with Cody [name] by phone.  I reviewed imaging findings with him.  The liver lesion is indeterminate by MRI and as an LR-3 lesion, meets criteria for imaging follow-up in 3 to 6 months.  I do not think that it is highly suspicious for [facility] at this time given MRI characteristics and normal AFP.  Biopsy at this time would also potentially be difficult given that it was not seen by ultrasound and vaguely enhances by CT and MRI.  This could potentially represent some type of benign perfusion abnormality/altered region of perfusion.  I recommended a follow-up MRI in 3 months in April.  The patient states that the prior MRI was difficult for him due to loud noise and difficulty comprehending breathing instructions.  There clearly is some breathing artifact on many of the sequences on the recent MRI exam.  He may benefit from some Xanax or Valium premedication just prior to MRI and we will try to help coordinate administration of a mild oral sedative prior to that MRI.  After the follow-up MRI, I will plan to meet back  with Cody [name] to discuss results and whether to proceed with biopsy in the future.  Thank you for this interesting consult.  I greatly enjoyed meeting Cody [name] and look forward to participating in their care.  A copy of this report was sent to the requesting provider on this date.  Electronically Signed: Reola Calkins 06/28/2019, 4:11 PM     I spent a total of  30 Minutes in remote  clinical consultation, greater than 50% of which was counseling/coordinating care for a liver lesion.  Visit type: Audio only (telephone). Audio (no video) only due to patient's lack of internet/smartphone capability. Alternative for in-person consultation at [facility], [address], [place], [place]. This visit type was conducted due to national recommendations for restrictions regarding the COVID-19 Pandemic (e.g. social distancing).  This format is felt to be most appropriate for this patient at this time.  All issues noted in this document were discussed and addressed.

## 2019-07-21 ENCOUNTER — Telehealth: Payer: Self-pay | Admitting: Gastroenterology

## 2019-07-21 NOTE — Telephone Encounter (Signed)
Please let patient know that I reviewed the report from interventional radiology regarding liver lesions.  Initial CT December 2020 showed 2 ill-defined heterogeneously enhancing mass is seen in the peripheral right hepatic lobe measuring 3.1 cm and 3.4 cm.  A probable subtle enhancing mass seen in the central left lobe measuring 3.3 cm.  This was in the background of hepatic cirrhosis.  Concern for possible multifocal hepatocellular carcinoma.  This was followed up with MRI abdomen in January 2021.  An ill-defined mass seen in the right hepatic lobe with 2 central cystic foci and ill-defined margins measuring 2.2 x 2.8 cm.  Indeterminate characteristics (LI RADS category 3).  Second tiny subcentimeter cystic focus seen in the lateral aspect of segment 5.  Few other tiny subcentimeter cyst noted in the right and left lobes.  Image degradation by motion artifact noted.  Seen by interventional radiology February 3 via telehealth.  Plans for follow-up MRI in 3 months in April.  Discussed with Dr. Jena Gauss. He would like patient to at least have consultation with oncology for indeterminate liver lesions (?multifocal HCC on CT and indeterminate liver lesions on MRI) in the interim. Please make arrangements.

## 2019-07-24 NOTE — Telephone Encounter (Signed)
Called patient and made aware of recommendations. He states he did not want a referral to oncology. He states he just can't do that right now. FYI to LSL

## 2019-08-05 ENCOUNTER — Ambulatory Visit: Payer: Self-pay | Attending: Internal Medicine

## 2019-08-05 DIAGNOSIS — Z23 Encounter for immunization: Secondary | ICD-10-CM

## 2019-08-05 NOTE — Progress Notes (Signed)
   Covid-19 Vaccination Clinic  Name:  AURTHER HARLIN    MRN: [id number] DOB: [birth year]  08/05/2019  Mr. Klemens was observed post Covid-19 immunization for 15 minutes without incident. He was provided with Vaccine Information Sheet and instruction to access the V-Safe system.   Mr. Bertino was instructed to call 911 with any severe reactions post vaccine: Marland Kitchen Difficulty breathing  . Swelling of face and throat  . A fast heartbeat  . A bad rash all over body  . Dizziness and weakness   Immunizations Administered    Name Date Dose VIS Date Route   Moderna COVID-19 Vaccine 08/05/2019 11:33 AM 0.5 mL 04/25/2019 Intramuscular   Manufacturer: Moderna   Lot: [id number]   NDC: 53299-242-68

## 2019-08-08 ENCOUNTER — Other Ambulatory Visit: Payer: Self-pay | Admitting: Interventional Radiology

## 2019-08-08 ENCOUNTER — Telehealth: Payer: Self-pay

## 2019-08-08 DIAGNOSIS — K769 Liver disease, unspecified: Secondary | ICD-10-CM

## 2019-08-08 NOTE — Telephone Encounter (Signed)
Spoke w/ pt stated that he was not able to schedule @ this time [facility] to schedule when ready.

## 2019-08-17 ENCOUNTER — Encounter: Payer: Self-pay | Admitting: Internal Medicine

## 2019-08-17 NOTE — Telephone Encounter (Signed)
PATIENT SCHEDULED AND LETTER SENT  °

## 2019-08-17 NOTE — Telephone Encounter (Addendum)
Noted. We will follow up repeat imaging in 08/2019.  Please offer follow up ov cirrhosis/liver lesions/esophageal varices

## 2019-08-17 NOTE — Telephone Encounter (Signed)
fowarding to SS to schedule OV

## 2019-09-06 ENCOUNTER — Ambulatory Visit: Payer: Self-pay | Attending: Internal Medicine

## 2019-09-06 DIAGNOSIS — Z23 Encounter for immunization: Secondary | ICD-10-CM

## 2019-09-06 NOTE — Progress Notes (Signed)
   Covid-19 Vaccination Clinic  Name:  Cody [name]    MRN: [id number] DOB: 1971-03-15  09/06/2019  Cody [name] was observed post Covid-19 immunization for 15 minutes without incident. He was provided with Vaccine Information Sheet and instruction to access the V-Safe system.   Cody [name] was instructed to call 911 with any severe reactions post vaccine: Marland Kitchen Difficulty breathing  . Swelling of face and throat  . A fast heartbeat  . A bad rash all over body  . Dizziness and weakness   Immunizations Administered    Name Date Dose VIS Date Route   Moderna COVID-19 Vaccine 09/06/2019 11:08 AM 0.5 mL 04/25/2019 Intramuscular   Manufacturer: Moderna   Lot: [id number]   NDC: 84166-063-01

## 2019-09-20 ENCOUNTER — Ambulatory Visit: Payer: Self-pay | Admitting: Gastroenterology

## 2019-09-20 ENCOUNTER — Telehealth: Payer: Self-pay | Admitting: Gastroenterology

## 2019-09-20 ENCOUNTER — Encounter: Payer: Self-pay | Admitting: Gastroenterology

## 2019-09-20 NOTE — Telephone Encounter (Signed)
PATIENT WAS A NO SHOW AND LETTER SENT  °

## 2020-12-16 IMAGING — US US ABDOMEN COMPLETE
1 series · 13 of 25 positions shown · non-contrast
Comparison: None.
COMPARISON: None.

Addendum:
CLINICAL DATA: Elevated liver function tests.

EXAM:
ABDOMEN ULTRASOUND COMPLETE

[Series 1: us abdomen complete · 13 of 136 slices shown]
[im 1/136]
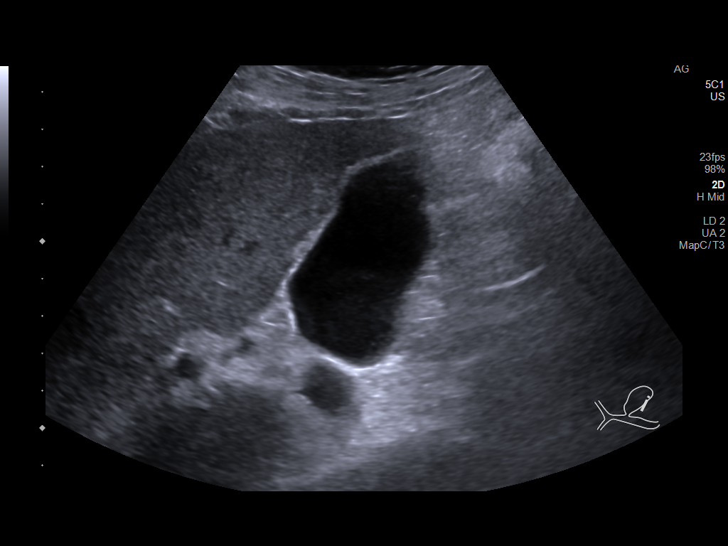
[im 12/136]
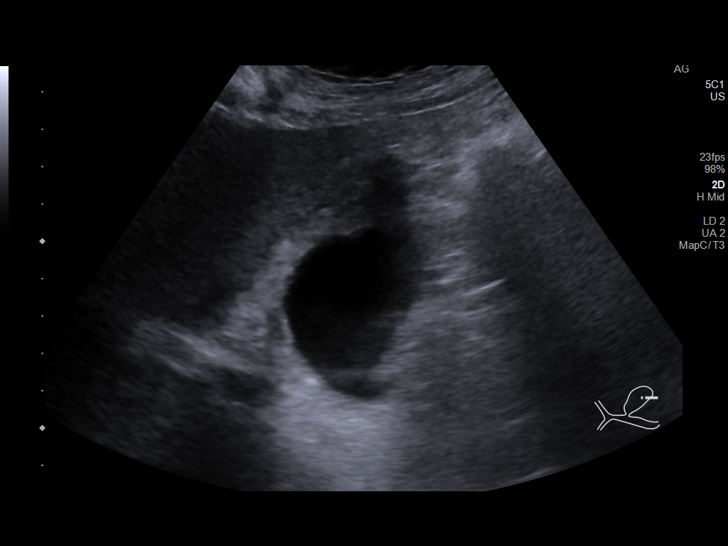
[im 23/136]
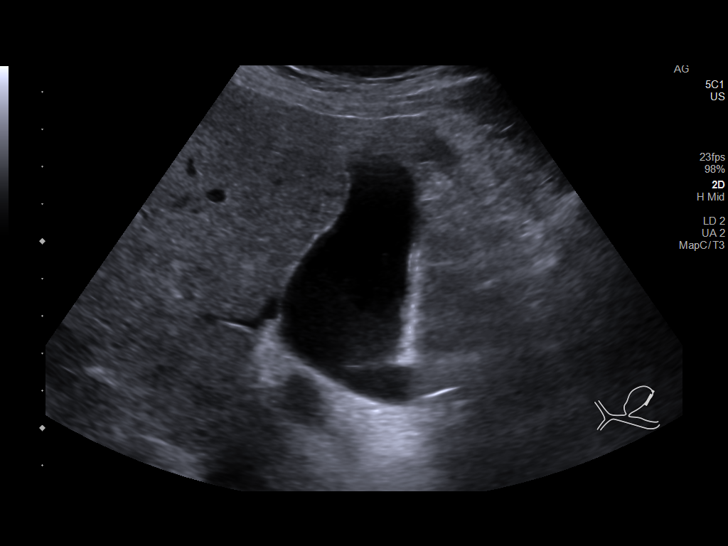
[im 34/136]
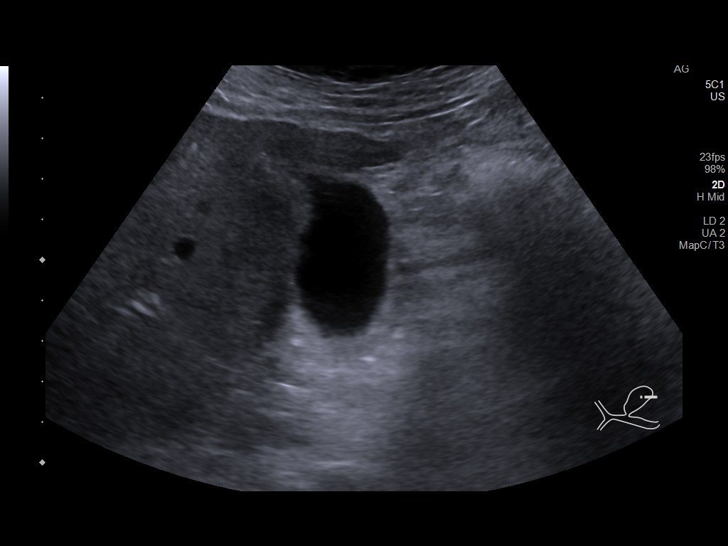
[im 46/136]
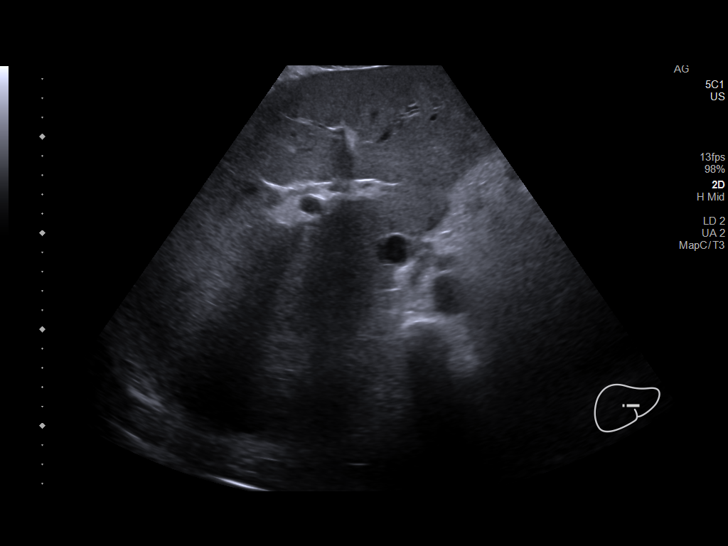
[im 57/136]
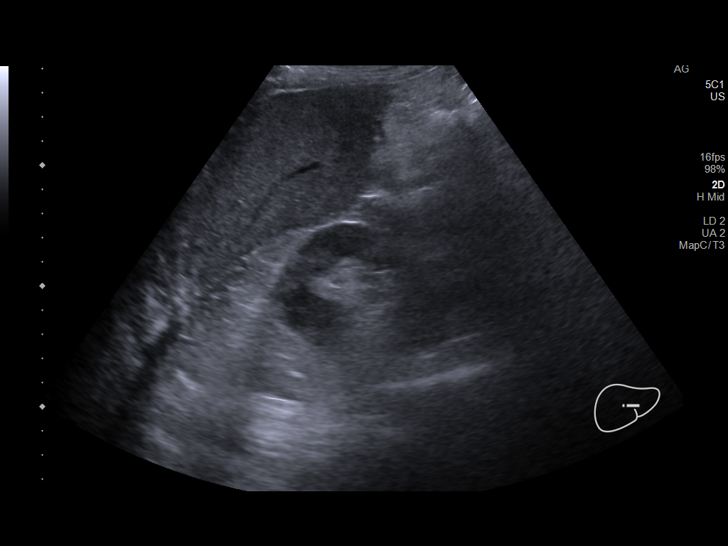
[im 68/136]
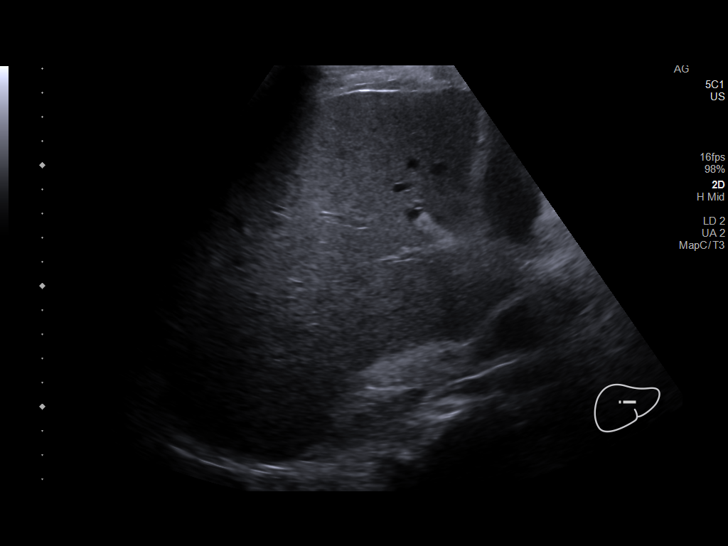
[im 79/136]
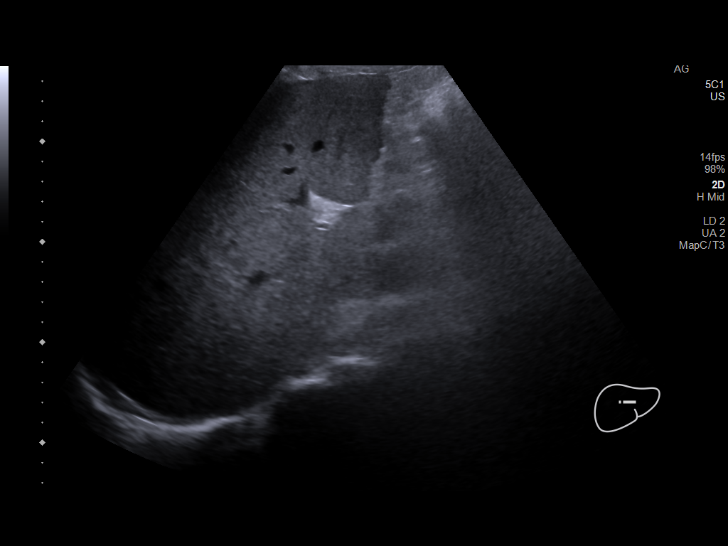
[im 91/136]
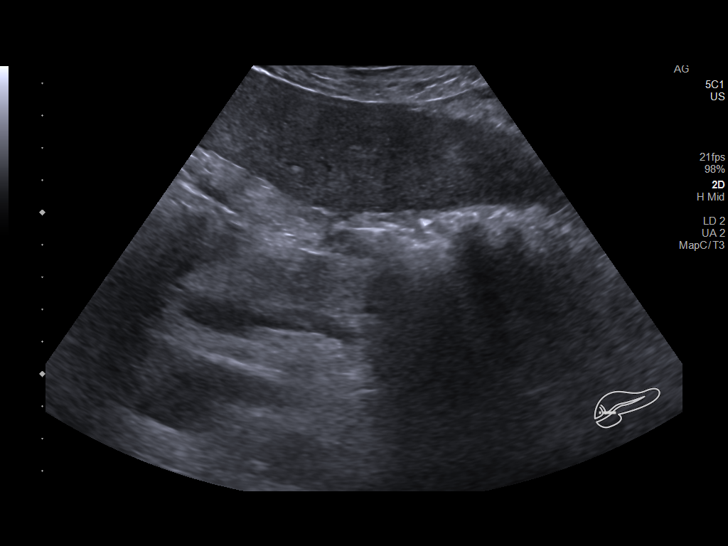
[im 102/136]
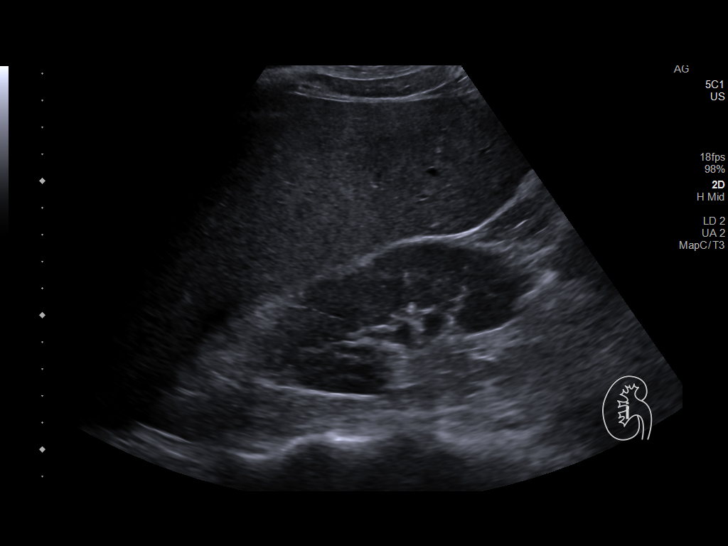
[im 113/136]
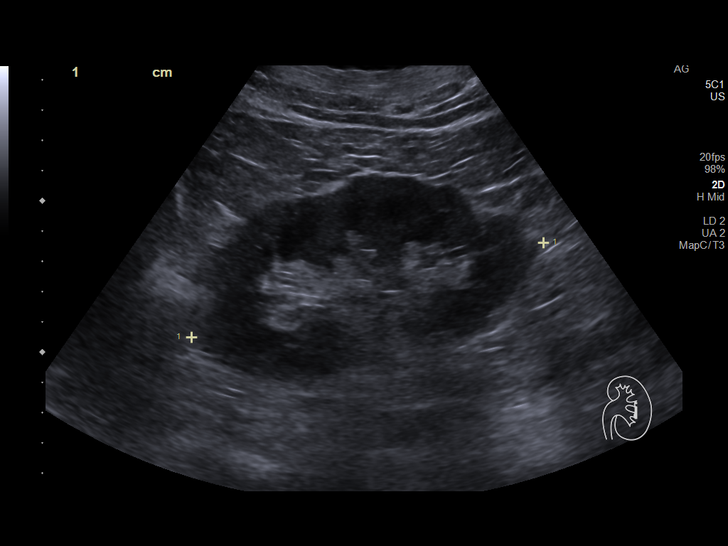
[im 124/136]
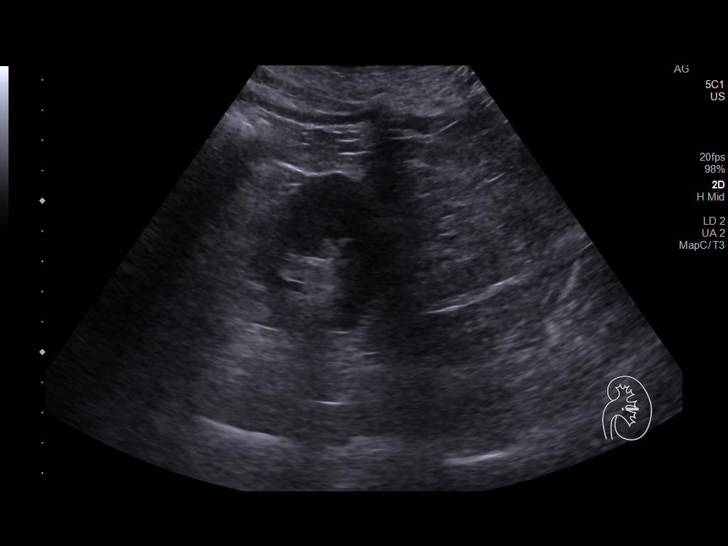
[im 136/136]
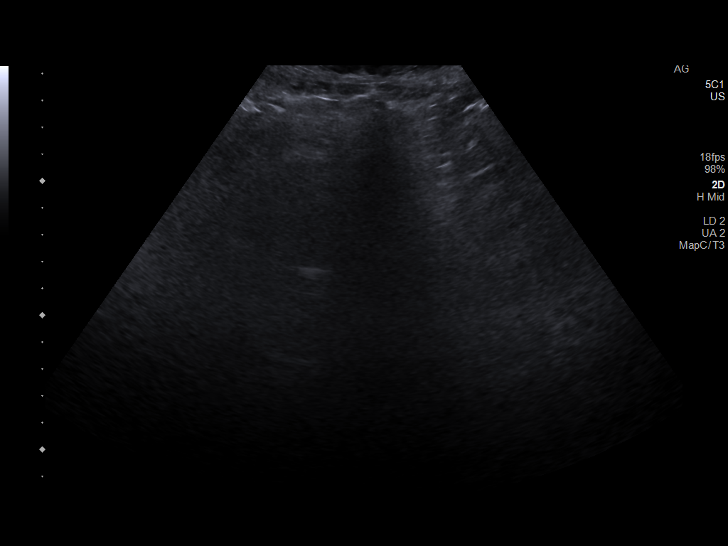

[13 of 25 positions shown; findings below may reference images not displayed]

FINDINGS: Gallbladder: No gallstones or wall thickening visualized. No
sonographic Murphy sign noted by sonographer. Mild amount of sludge
is noted in the gallbladder lumen.

Common bile duct: Diameter: 2 mm which is within normal limits.

Liver: No focal lesion identified. Mildly increased echogenicity of
hepatic parenchyma is noted suggesting hepatic steatosis or other
diffuse hepatocellular disease. Portal vein is patent on color
Doppler imaging with normal direction of blood flow towards the
liver.

IVC: No abnormality visualized.

Pancreas: Not well visualized due to overlying bowel gas.

Spleen: Size and appearance within normal limits.

Right Kidney: Length: 11 cm. Echogenicity within normal limits. No
mass or hydronephrosis visualized.

Left Kidney: Length: 12 cm. Echogenicity within normal limits. No
mass or hydronephrosis visualized.

Abdominal aorta: No aneurysm visualized.

Other findings: Minimal ascites is noted in the gallbladder fossa.
IMPRESSION: Mildly increased echogenicity of hepatic parenchyma is noted
suggesting hepatic steatosis or other diffuse hepatocellular
disease. Minimal para patent ascites is noted in the region of the
gallbladder fossa.

Pancreas not well visualized due to overlying bowel gas.

No other abnormality seen in the abdomen.

ADDENDUM:
There may be some minimal nodularity of the hepatic margins present
which could suggest early hepatic cirrhosis.

*** End of Addendum ***
FINDINGS: Gallbladder: No gallstones or wall thickening visualized. No
sonographic Murphy sign noted by sonographer. Mild amount of sludge
is noted in the gallbladder lumen.

Common bile duct: Diameter: 2 mm which is within normal limits.

Liver: No focal lesion identified. Mildly increased echogenicity of
hepatic parenchyma is noted suggesting hepatic steatosis or other
diffuse hepatocellular disease. Portal vein is patent on color
Doppler imaging with normal direction of blood flow towards the
liver.

IVC: No abnormality visualized.

Pancreas: Not well visualized due to overlying bowel gas.

Spleen: Size and appearance within normal limits.

Right Kidney: Length: 11 cm. Echogenicity within normal limits. No
mass or hydronephrosis visualized.

Left Kidney: Length: 12 cm. Echogenicity within normal limits. No
mass or hydronephrosis visualized.

Abdominal aorta: No aneurysm visualized.

Other findings: Minimal ascites is noted in the gallbladder fossa.
IMPRESSION: Mildly increased echogenicity of hepatic parenchyma is noted
suggesting hepatic steatosis or other diffuse hepatocellular
disease. Minimal para patent ascites is noted in the region of the
gallbladder fossa.

Pancreas not well visualized due to overlying bowel gas.

No other abnormality seen in the abdomen.

## 2021-01-07 IMAGING — CT CT ABD-PELV W/ CM
3 of 9 series · 12 of 46 positions shown, 18 images · IV contrast (Omnipaque or Isovue)
Comparison: None.

CLINICAL DATA: Elevated liver function tests. Bleeding esophageal
varices. Weight loss over past month.

EXAM:
CT ABDOMEN AND PELVIS WITH CONTRAST
TECHNIQUE: Multidetector CT imaging of the abdomen and pelvis was performed
using the standard protocol following bolus administration of
intravenous contrast.
CONTRAST:  100mL OMNIPAQUE IOHEXOL 300 MG/ML  SOLN

[Series 3: axial arterial · axial · arterial · 0.79mm/px · z∈[-498,-432]mm · 2 of 112 slices shown]
[im 23/112  soft-tissue]
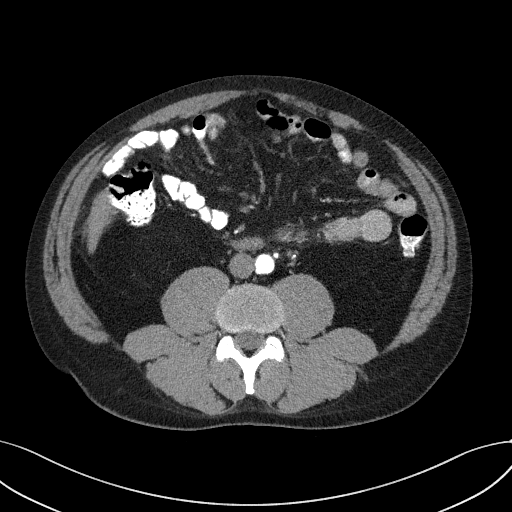
[im 45/112  soft-tissue]
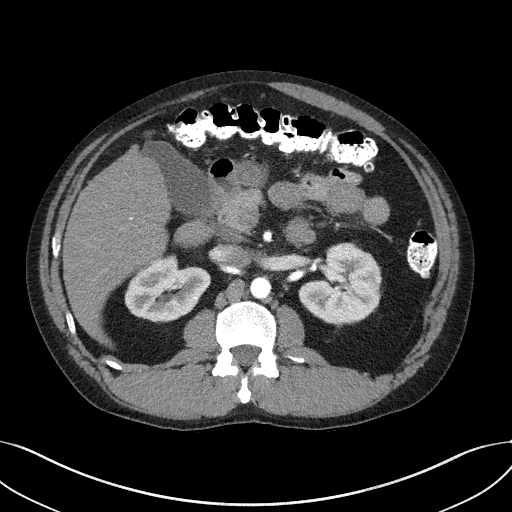

[Series 6: coronal arterial · coronal · arterial · 0.65mm/px · 3 of 107 slices shown, 4 images]
[im 27/107  soft-tissue]
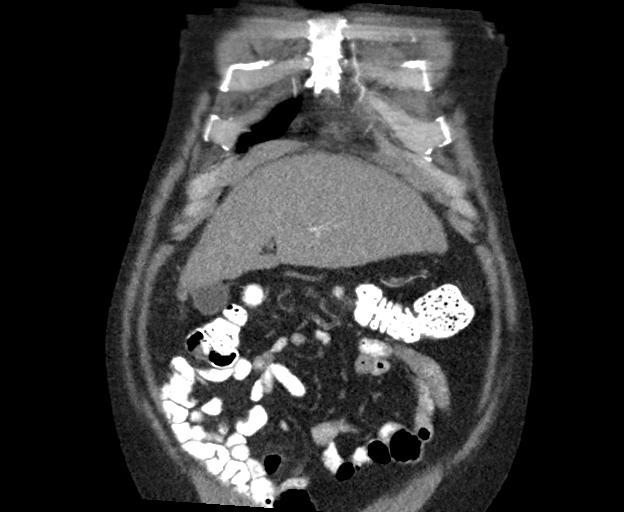
[im 54/107  soft-tissue]
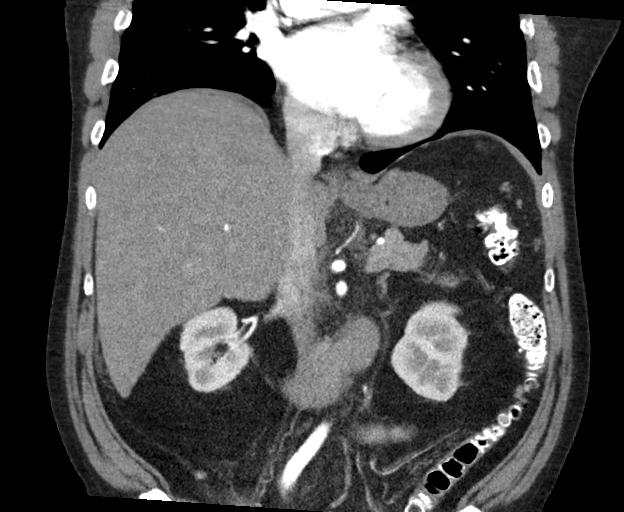
[im 54/107  bone]
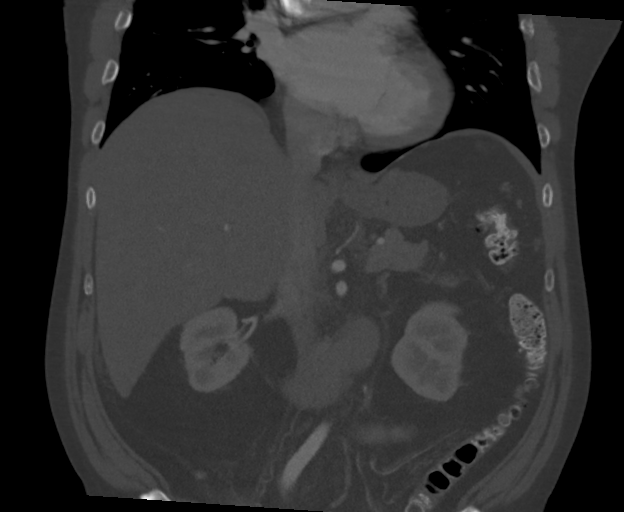
[im 80/107  soft-tissue]
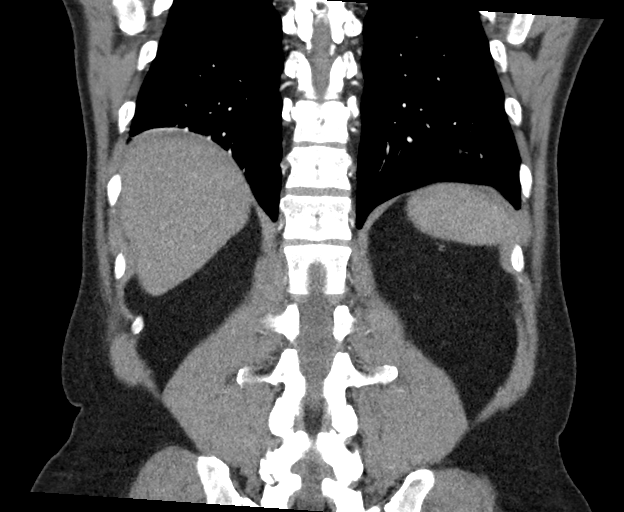

[Series 8: axial venous · axial · portal-venous · 0.73mm/px · z∈[-698,-300]mm · 7 of 179 slices shown, 12 images]
[im 23/179  soft-tissue]
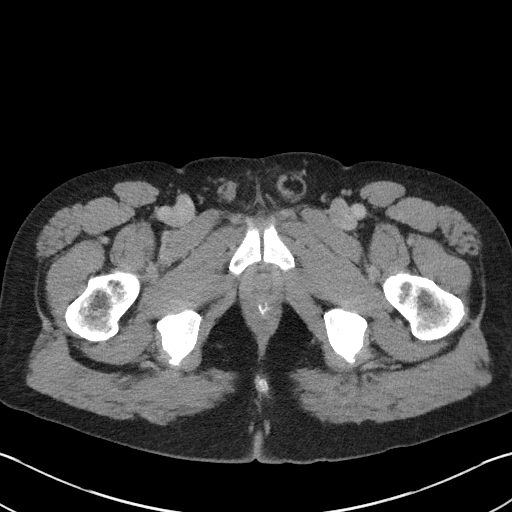
[im 23/179  bone]
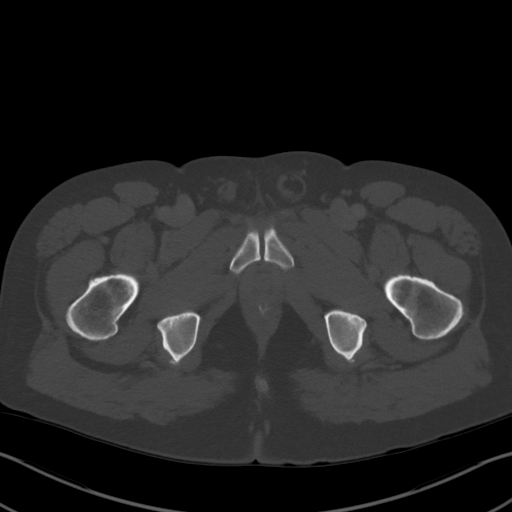
[im 45/179  soft-tissue]
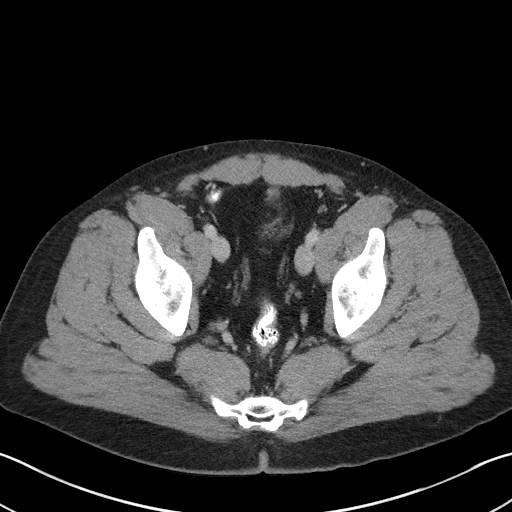
[im 67/179  soft-tissue]
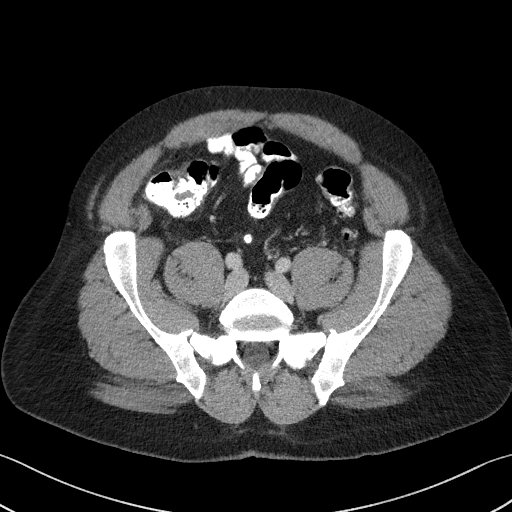
[im 90/179  soft-tissue]
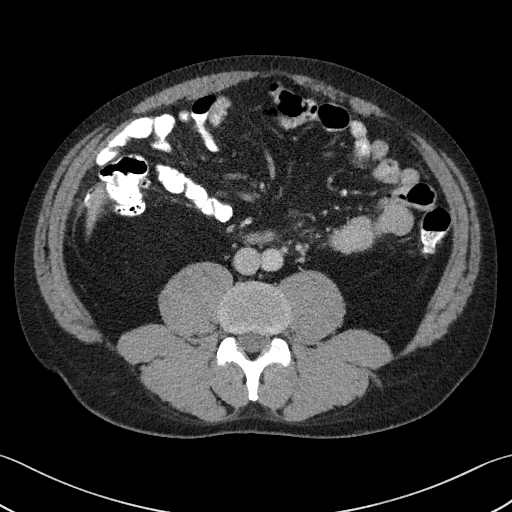
[im 90/179  lung]
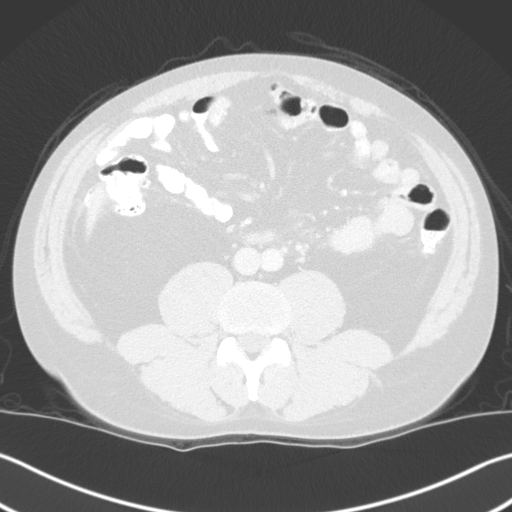
[im 112/179  soft-tissue]
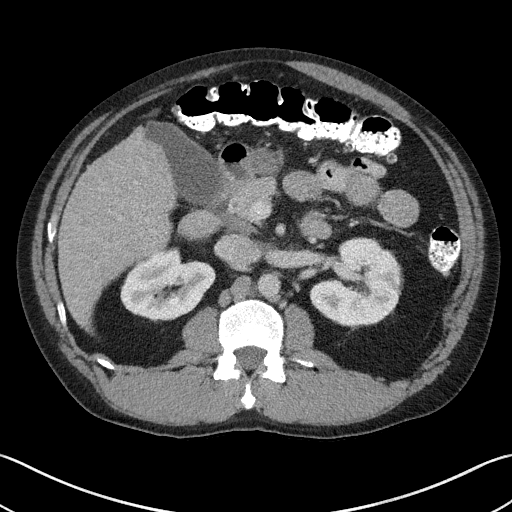
[im 112/179  lung]
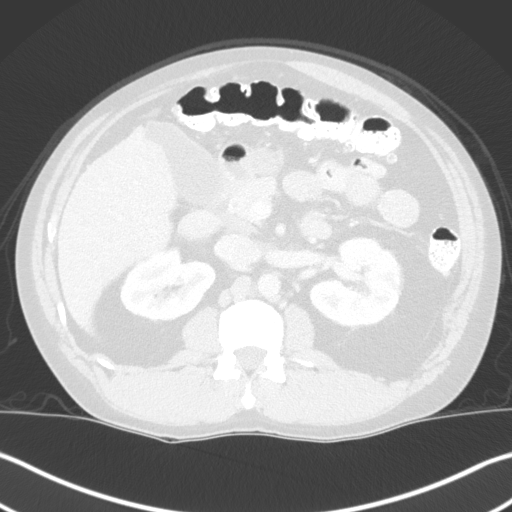
[im 134/179  soft-tissue]
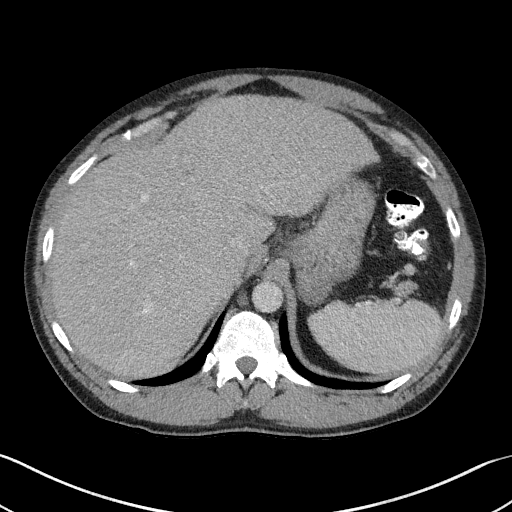
[im 134/179  lung]
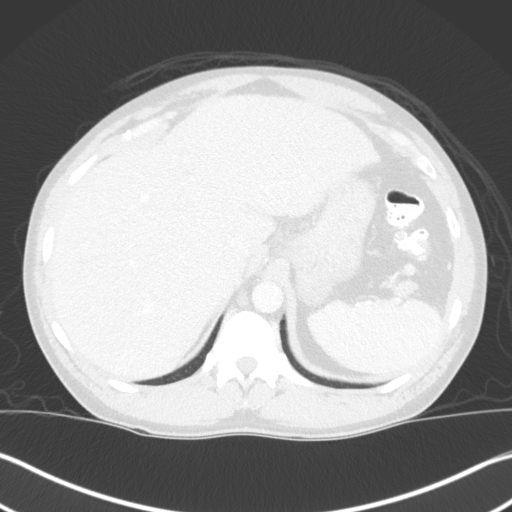
[im 156/179  soft-tissue]
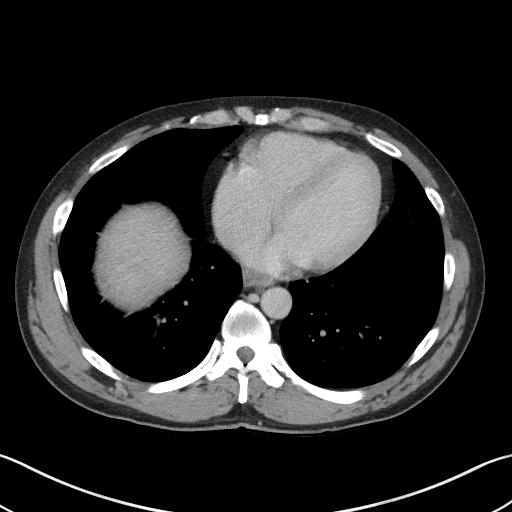
[im 156/179  lung]
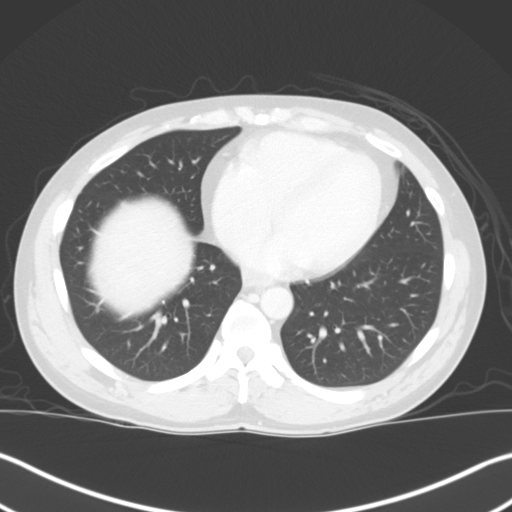

[12 of 46 positions shown; findings below may reference images not displayed]

FINDINGS: Lower Chest: Hepatic cirrhosis is demonstrated. Two ill-defined
heterogeneously enhancing masses are seen in the peripheral right
hepatic lobe which shows central cystic foci, and measure
approximately 3.1 cm and 3.4 cm in maximum diameter. A probable a
subtle enhancing mass is also seen in the central left lobe
measuring 3.3 cm. These are suspicious for hepatocellular carcinoma
in the setting of cirrhosis.

Gallbladder is unremarkable. No evidence of biliary ductal
dilatation.

Hepatobiliary: No hepatic masses identified.

Pancreas:  No mass or inflammatory changes.

Spleen: Within normal limits in size and appearance.

Adrenals/Urinary Tract: No masses identified. No evidence of
hydronephrosis.

Stomach/Bowel: Wall thickening of distal esophagus suspicious for
esophagitis. No evidence of hiatal hernia. No evidence of
obstruction, inflammatory process or abnormal fluid collections.
Normal appendix visualized. Diffuse colonic diverticulosis is noted,
without evidence of diverticulitis.

Vascular/Lymphatic: No pathologically enlarged lymph nodes. No
abdominal aortic aneurysm.

Reproductive:  No mass or other significant abnormality.

Other:  No evidence of ascites.

Musculoskeletal:  No suspicious bone lesions identified.
IMPRESSION: 1. Hepatic cirrhosis. Several subtle heterogeneously enhancing liver
lesions, suspicious for multifocal hepatocellular carcinoma. Abdomen
MRI without and with contrast is recommended for further
characterization.
2. No evidence of abdominal or pelvic metastatic disease.
3. Wall thickening of distal esophagus, suspicious for esophagitis.
4. Colonic diverticulosis, without radiographic evidence of
diverticulitis.

## 2021-01-29 ENCOUNTER — Encounter (HOSPITAL_COMMUNITY): Payer: Self-pay

## 2021-01-29 ENCOUNTER — Inpatient Hospital Stay (HOSPITAL_COMMUNITY)
Admission: EM | Admit: 2021-01-29 | Discharge: 2021-02-01 | DRG: 812 | Disposition: A | Discharge status: Home / Self Care | Payer: Self-pay | Attending: Internal Medicine | Admitting: Internal Medicine

## 2021-01-29 ENCOUNTER — Other Ambulatory Visit: Payer: Self-pay

## 2021-01-29 DIAGNOSIS — D62 Acute posthemorrhagic anemia: Principal | ICD-10-CM | POA: Diagnosis present

## 2021-01-29 DIAGNOSIS — I10 Essential (primary) hypertension: Secondary | ICD-10-CM | POA: Diagnosis present

## 2021-01-29 DIAGNOSIS — Z87891 Personal history of nicotine dependence: Secondary | ICD-10-CM

## 2021-01-29 DIAGNOSIS — K7030 Alcoholic cirrhosis of liver without ascites: Secondary | ICD-10-CM | POA: Diagnosis present

## 2021-01-29 DIAGNOSIS — Z20822 Contact with and (suspected) exposure to covid-19: Secondary | ICD-10-CM | POA: Diagnosis present

## 2021-01-29 DIAGNOSIS — R739 Hyperglycemia, unspecified: Secondary | ICD-10-CM | POA: Diagnosis present

## 2021-01-29 DIAGNOSIS — Z9119 Patient's noncompliance with other medical treatment and regimen: Secondary | ICD-10-CM

## 2021-01-29 DIAGNOSIS — K3189 Other diseases of stomach and duodenum: Secondary | ICD-10-CM | POA: Diagnosis present

## 2021-01-29 DIAGNOSIS — K2100 Gastro-esophageal reflux disease with esophagitis, without bleeding: Secondary | ICD-10-CM | POA: Diagnosis present

## 2021-01-29 DIAGNOSIS — F1010 Alcohol abuse, uncomplicated: Secondary | ICD-10-CM | POA: Diagnosis present

## 2021-01-29 DIAGNOSIS — K922 Gastrointestinal hemorrhage, unspecified: Secondary | ICD-10-CM | POA: Diagnosis present

## 2021-01-29 DIAGNOSIS — Z8719 Personal history of other diseases of the digestive system: Secondary | ICD-10-CM

## 2021-01-29 DIAGNOSIS — I8511 Secondary esophageal varices with bleeding: Secondary | ICD-10-CM | POA: Diagnosis present

## 2021-01-29 DIAGNOSIS — K766 Portal hypertension: Secondary | ICD-10-CM | POA: Diagnosis present

## 2021-01-29 DIAGNOSIS — I8501 Esophageal varices with bleeding: Secondary | ICD-10-CM | POA: Diagnosis present

## 2021-01-29 DIAGNOSIS — I8510 Secondary esophageal varices without bleeding: Secondary | ICD-10-CM | POA: Diagnosis present

## 2021-01-29 DIAGNOSIS — K2210 Ulcer of esophagus without bleeding: Secondary | ICD-10-CM | POA: Diagnosis present

## 2021-01-29 LAB — COMPREHENSIVE METABOLIC PANEL
ALT: 29 U/L (ref 0–44)
AST: 63 U/L — ABNORMAL HIGH (ref 15–41)
Albumin: 3.4 g/dL — ABNORMAL LOW (ref 3.5–5.0)
Alkaline Phosphatase: 116 U/L (ref 38–126)
Anion gap: 9 (ref 5–15)
BUN: 27 mg/dL — ABNORMAL HIGH (ref 6–20)
CO2: 23 mmol/L (ref 22–32)
Calcium: 8.8 mg/dL — ABNORMAL LOW (ref 8.9–10.3)
Chloride: 107 mmol/L (ref 98–111)
Creatinine, Ser: 1.23 mg/dL (ref 0.61–1.24)
GFR, Estimated: >60 mL/min (ref >60)
Glucose, Bld: 163 mg/dL — ABNORMAL HIGH (ref 70–99)
Potassium: 3.7 mmol/L (ref 3.5–5.1)
Sodium: 139 mmol/L (ref 135–145)
Total Bilirubin: 3 mg/dL — ABNORMAL HIGH (ref 0.3–1.2)
Total Protein: 8 g/dL (ref 6.5–8.1)

## 2021-01-29 LAB — PROTIME-INR
INR: 1.3 — ABNORMAL HIGH (ref 0.8–1.2)
Prothrombin Time: 15.9 seconds — ABNORMAL HIGH (ref 11.4–15.2)

## 2021-01-29 LAB — CBC
HCT: 39.7 % (ref 39.0–52.0)
Hemoglobin: 13.4 g/dL (ref 13.0–17.0)
MCH: 30.8 pg (ref 26.0–34.0)
MCHC: 33.8 g/dL (ref 30.0–36.0)
MCV: 91.3 fL (ref 80.0–100.0)
Platelets: 222 10*3/uL (ref 150–400)
RBC: 4.35 MIL/uL (ref 4.22–5.81)
RDW: 15 % (ref 11.5–15.5)
WBC: 10.2 10*3/uL (ref 4.0–10.5)
nRBC: 0 % (ref 0.0–0.2)

## 2021-01-29 LAB — HIV ANTIBODY (ROUTINE TESTING W REFLEX): HIV Screen 4th Generation wRfx: NONREACTIVE

## 2021-01-29 LAB — RESP PANEL BY RT-PCR (FLU A&B, COVID) ARPGX2
Influenza A by PCR: NEGATIVE
Influenza B by PCR: NEGATIVE
SARS Coronavirus 2 by RT PCR: NEGATIVE

## 2021-01-29 LAB — TYPE AND SCREEN
ABO/RH(D): O POS
Antibody Screen: NEGATIVE

## 2021-01-29 LAB — POC OCCULT BLOOD, ED: Fecal Occult Bld: POSITIVE — AB

## 2021-01-29 LAB — ETHANOL: Alcohol, Ethyl (B): <10 mg/dL (ref <10)

## 2021-01-29 MED ORDER — ONDANSETRON HCL 4 MG PO TABS: 4.0000 mg | ORAL_TABLET | Freq: Four times a day (QID) | ORAL | Status: DC | PRN

## 2021-01-29 MED ORDER — ACETAMINOPHEN 325 MG PO TABS: 650.0000 mg | ORAL_TABLET | Freq: Four times a day (QID) | ORAL | Status: DC | PRN

## 2021-01-29 MED ORDER — PANTOPRAZOLE SODIUM 40 MG IV SOLR
40.0000 mg | Freq: Once | INTRAVENOUS | Status: AC
  Administered 2021-01-29: 40 mg via INTRAVENOUS
  Filled 2021-01-29: qty 40

## 2021-01-29 MED ORDER — POTASSIUM CHLORIDE IN NACL 20-0.9 MEQ/L-% IV SOLN
INTRAVENOUS | Status: DC
  Filled 2021-01-29 (×2): qty 1000

## 2021-01-29 MED ORDER — FOLIC ACID 1 MG PO TABS
1.0000 mg | ORAL_TABLET | Freq: Every day | ORAL | Status: DC
  Administered 2021-01-29 – 2021-02-01 (×4): 1 mg via ORAL
  Filled 2021-01-29 (×4): qty 1

## 2021-01-29 MED ORDER — ADULT MULTIVITAMIN W/MINERALS CH
1.0000 | ORAL_TABLET | Freq: Every day | ORAL | Status: DC
  Administered 2021-01-29 – 2021-01-31 (×3): 1 via ORAL
  Filled 2021-01-29 (×4): qty 1

## 2021-01-29 MED ORDER — ACETAMINOPHEN 650 MG RE SUPP: 650.0000 mg | Freq: Four times a day (QID) | RECTAL | Status: DC | PRN

## 2021-01-29 MED ORDER — OCTREOTIDE LOAD VIA INFUSION
50.0000 ug | Freq: Once | INTRAVENOUS | Status: AC
  Administered 2021-01-29: 50 ug via INTRAVENOUS
  Filled 2021-01-29: qty 25

## 2021-01-29 MED ORDER — SODIUM CHLORIDE 0.9 % IV SOLN
50.0000 ug/h | INTRAVENOUS | Status: DC
  Administered 2021-01-29 – 2021-01-30 (×3): 50 ug/h via INTRAVENOUS
  Filled 2021-01-29 (×5): qty 1

## 2021-01-29 MED ORDER — LORAZEPAM 1 MG PO TABS: 1.0000 mg | ORAL_TABLET | ORAL | Status: DC | PRN

## 2021-01-29 MED ORDER — ONDANSETRON HCL 4 MG/2ML IJ SOLN
4.0000 mg | Freq: Four times a day (QID) | INTRAMUSCULAR | Status: DC | PRN
  Administered 2021-01-30: 4 mg via INTRAVENOUS
  Filled 2021-01-29: qty 2

## 2021-01-29 MED ORDER — LORAZEPAM 2 MG/ML IJ SOLN: 1.0000 mg | INTRAMUSCULAR | Status: DC | PRN

## 2021-01-29 MED ORDER — CEFTRIAXONE SODIUM 1 G IJ SOLR
1.0000 g | INTRAMUSCULAR | Status: DC
  Administered 2021-01-29 – 2021-01-31 (×3): 1 g via INTRAVENOUS
  Filled 2021-01-29 (×3): qty 10

## 2021-01-29 MED ORDER — THIAMINE HCL 100 MG PO TABS
100.0000 mg | ORAL_TABLET | Freq: Every day | ORAL | Status: DC
  Administered 2021-01-29 – 2021-02-01 (×4): 100 mg via ORAL
  Filled 2021-01-29 (×5): qty 1

## 2021-01-29 MED ORDER — THIAMINE HCL 100 MG/ML IJ SOLN: 100.0000 mg | Freq: Every day | INTRAMUSCULAR | Status: DC

## 2021-01-29 NOTE — H&P (View-Only) (Signed)
Referring Provider: No ref. provider found Primary Care Physician:  [facility] Primary Gastroenterologist:  Dr. Jena Gauss  Date of Admission: 01/29/21 Date of Consultation: 01/29/21  Reason for Consultation:  Hematemesis/melena  HPI:  Cody [name] is a 50 y.o. year old male with pmh of HTN and daily alcohol abuse. Presented to the ED today with hematemesis since yesterday morning as well as some rectal bleeding. Hx of the same 2 years ago. He states he drank alcohol 2 days ago and woke up yesterday vomiting blood with 4-5 reported episodes yesterday. He also noted some melena in stools, reports some dizziness and continued nausea today.   ED Course: BUN 27, Album 3.4, AST 63, T Bili 3, INR 1.3, hgb 13.4, FOBT positive.   Consult: Patient was previously followed By Dr. Jena Gauss, he had similar presentation 2 years ago, presenting with hematemesis, he had an EGD at that time that revealed Grade II and Grade III varices with stigmata of bleeding, eradicated, banded, portal hypertensive gastropathy and mild gastritis.   EGD again last year with noted varices and ulcers of esophagus secondary to esophagitis, s/p banding again, hx of cirrhosis with a liver mass concerning for hepatocellular carcinoma without oncology follow up. No current anticoagulation.   He states that he drank 24 oz of wine and a shot of vodka on Monday night due to the holiday. He woke up on Tuesday morning with nausea and hematemesis. He drank water and stated he felt a little better by bedtime. He reports that this morning he had more nausea hematemesis with some blood clots as well as an episode of black diarrhea. He denies any abdominal pain. No episodes of BRBPR. He denies swelling to his abdomen or LEs, he does endorse some dizziness and some fatigue.   He reports that he drinks 1 shot of liquor every evening. He denies any recent weight loss, denies any issues with his appetite or early satiety. Denies  NSAID use. Denies tobacco use.   He was lost to follow with [facility] on outpatient basis for further management of his cirrhosis.   Cirrhosis related questions: Episodes of confusion/disorientation: no Taking diuretics? no Beta blockers? Nadolol 20mg  daily Prior history of variceal banding? yes Prior episodes of SBP? no Last liver imaging: MRI 05/2019, 06/2019 Abdomen Complete 04/2021 Alcohol use: yes  Previous labs: Hep A antibody reactive, Hep B surface antigen non reactive, HCV Ab non reactive, Hep B S Ab nonreactive  MRI abdomen: (06/06/19) hepatic cirrhosis with at least 1 mass in R lobe measuring 3.2 cm, indeterminate characteristics, hepatocellular carcinoma cannot be excluded in setting of cirrhosis  EGD: 1/11/21Moderately severe erosive/ulcerative reflux esophagitis. Esophageal varices, 3 colums of grade II varices.Status Post esophageal band ligation - Small hiatal hernia. - Portal hypertensive gastropathy. - Normal duodenal bulb and second portion of the duodenum. - No specimens collected. Colonoscopy: never  08/03/19 Abd Complete: (05/02/20) mildly increased echogenicity of hepatic parenchyma is noted suggesting hepatic steatosis or other diffuse hepatocellular disease. Minimal para patent ascites is noted in region of gallbladder fossa.   Past Medical History:  Diagnosis Date   Hypertension     Past Surgical History:  Procedure Laterality Date   ESOPHAGEAL BANDING N/A 06/05/2019   Procedure: ESOPHAGEAL BANDING;  Surgeon: 08/03/2019, MD;  Location: [facility];  Service: Endoscopy;  Laterality: N/A;   ESOPHAGOGASTRODUODENOSCOPY  02/10/2007   Dr. 02/12/2007; distal esophageal erosions consistent with reflux esophagitis, moderate sized hiatal hernia, submucosal gastric hemorrhage.   ESOPHAGOGASTRODUODENOSCOPY (  EGD) WITH PROPOFOL N/A 05/02/2019   Procedure: ESOPHAGOGASTRODUODENOSCOPY (EGD) WITH PROPOFOL;  Surgeon: West Bali, MD;  Location: [facility];  Service: Endoscopy;   Laterality: N/A;   ESOPHAGOGASTRODUODENOSCOPY (EGD) WITH PROPOFOL N/A 06/05/2019   Procedure: ESOPHAGOGASTRODUODENOSCOPY (EGD) WITH PROPOFOL;  Surgeon: Corbin Ade, MD;  Location: [facility];  Service: Endoscopy;  Laterality: N/A;  2:45pm - pt can not come earlier   IR RADIOLOGIST EVAL & MGMT  06/28/2019   TONSILLECTOMY  childhood    Prior to Admission medications   Medication Sig Start Date End Date Taking? Authorizing Provider  amLODipine (NORVASC) 5 MG tablet Take 5 mg by mouth daily.   Yes [provider]  metoprolol tartrate (LOPRESSOR) 50 MG tablet Take 50 mg by mouth 2 (two) times daily.   Yes [provider]  Multiple Vitamin (MULTIVITAMIN WITH MINERALS) TABS tablet Take 1 tablet by mouth daily. Patient not taking: Reported on 01/29/2021 05/05/19   Vassie Loll, MD  nadolol (CORGARD) 20 MG tablet Take 1 tablet (20 mg total) by mouth daily. Patient not taking: Reported on 01/29/2021 05/05/19   Vassie Loll, MD  pantoprazole (PROTONIX) 40 MG tablet Take 1 tab by mouth twice a day for 1 month; than 1 tablet daily. Patient not taking: Reported on 01/29/2021 05/05/19   Vassie Loll, MD    Current Facility-Administered Medications  Medication Dose Route Frequency Provider Last Rate Last Admin   octreotide (SANDOSTATIN) 2 mcg/mL load via infusion 50 mcg  50 mcg Intravenous Once Tat, David, MD       And   octreotide (SANDOSTATIN) 500 mcg in sodium chloride 0.9 % 250 mL (2 mcg/mL) infusion  50 mcg/hr Intravenous Continuous Tat, David, MD       pantoprazole (PROTONIX) injection 40 mg  40 mg Intravenous Once Carroll Sage, PA-C       Current Outpatient Medications  Medication Sig Dispense Refill   amLODipine (NORVASC) 5 MG tablet Take 5 mg by mouth daily.     metoprolol tartrate (LOPRESSOR) 50 MG tablet Take 50 mg by mouth 2 (two) times daily.     Multiple Vitamin (MULTIVITAMIN WITH MINERALS) TABS tablet Take 1 tablet by mouth daily. (Patient not taking:  Reported on 01/29/2021) 30 tablet 3   nadolol (CORGARD) 20 MG tablet Take 1 tablet (20 mg total) by mouth daily. (Patient not taking: Reported on 01/29/2021) 30 tablet 1   pantoprazole (PROTONIX) 40 MG tablet Take 1 tab by mouth twice a day for 1 month; than 1 tablet daily. (Patient not taking: Reported on 01/29/2021) 60 tablet 1    Allergies as of 01/29/2021   (No Known Allergies)    Family History  Problem Relation Age of Onset   Colon cancer Neg Hx    Stomach cancer Neg Hx    Esophageal cancer Neg Hx     Social History   Socioeconomic History   Marital status: Single    Spouse name: Not on file   Number of children: Not on file   Years of education: Not on file   Highest education level: Not on file  Occupational History   Not on file  Tobacco Use   Smoking status: Former    Packs/day: 2.00    Years: 15.00    Pack years: 30.00    Types: Cigarettes    Quit date: 05/31/1998    Years since quitting: 22.6   Smokeless tobacco: Never  Vaping Use   Vaping Use: Never used  Substance and  Sexual Activity   Alcohol use: Yes    Comment: 2 shots of vodka a day   Drug use: No   Sexual activity: Yes  Other Topics Concern   Not on file  Social History Narrative   Not on file   Social Determinants of Health   Financial Resource Strain: Not on file  Food Insecurity: Not on file  Transportation Needs: Not on file  Physical Activity: Not on file  Stress: Not on file  Social Connections: Not on file  Intimate Partner Violence: Not on file    Review of Systems: Gen: Denies fever, chills, loss of appetite, change in weight or weight loss CV: Denies chest pain, heart palpitations, syncope, edema  Resp: Denies shortness of breath with rest, cough, wheezing GI: Denies dysphagia or odynophagia. +hematemesis, +melena, GU : Denies urinary burning, urinary frequency, urinary incontinence.  MS: Denies joint pain,swelling, cramping Derm: Denies rash, itching, dry skin Psych: Denies  depression, anxiety,confusion, or memory loss Heme: Denies bruising, bleeding, and enlarged lymph nodes.  Physical Exam: Vital signs in last 24 hours: Temp:  [98.3 F (36.8 C)] 98.3 F (36.8 C) (09/07 0905) Pulse Rate:  [81-90] 90 (09/07 1400) Resp:  [16-18] 18 (09/07 1400) BP: (108-130)/(78-98) 130/98 (09/07 1400) SpO2:  [95 %-99 %] 99 % (09/07 1400) Weight:  [85 kg] 85 kg (09/07 0906)   General:   Alert,  Well-developed, well-nourished, pleasant and cooperative in NAD Head:  Normocephalic and atraumatic. Eyes:  Sclera clear, no icterus.   Conjunctiva pink. Ears:  Normal auditory acuity. Nose:  No deformity, discharge,  or lesions. Mouth:  No deformity or lesions, dentition normal. Lungs:  Clear throughout to auscultation.   No wheezes, crackles, or rhonchi. No acute distress. Heart:  Regular rate and rhythm; no murmurs, clicks, rubs,  or gallops. Abdomen:  Soft, nontender, mildly distended no tense ascites. No masses, hepatosplenomegaly or hernias noted. Normal bowel sounds, without guarding, and without rebound.   Rectal:  Deferred until time of colonoscopy.   Msk:  Symmetrical without gross deformities. Normal posture. Pulses:  Normal pulses noted. Extremities:  Without clubbing or edema. Neurologic:  Alert and  oriented x4;  grossly normal neurologically. No asterixis Skin:  Intact without significant lesions or rashes. Psych:  Alert and cooperative. Normal mood and affect.   Lab Results: Recent Labs    01/29/21 0921  WBC 10.2  HGB 13.4  HCT 39.7  PLT 222   BMET Recent Labs    01/29/21 0921  NA 139  K 3.7  CL 107  CO2 23  GLUCOSE 163*  BUN 27*  CREATININE 1.23  CALCIUM 8.8*   LFT Recent Labs    01/29/21 0921  PROT 8.0  ALBUMIN 3.4*  AST 63*  ALT 29  ALKPHOS 116  BILITOT 3.0*   PT/INR Recent Labs    01/29/21 0924  LABPROT 15.9*  INR 1.3*    Studies/Results: No results found.  Impression: Cody [name] is a 50-year-old male with  history of alcoholic cirrhosis, esophageal varices who presented to ED today after having hematemesis starting Tuesday morning. Patient reports that he drank 24 oz of wine and 1 shot of vodka Monday night, (typically has one shot every evening), states that Tuesday he woke up with nausea and vomiting bright red blood with clots. He drank water and felt better by bedtime, however, he woke up this morning and had hematemesis again as well as an episode of melena. He denies any abdominal pain.   Cirrhosis:   secondary to ETOH abuse. Diagnosed in 2020 after he presented to ED for hematemesis, EGD revealed variceal bleed, grade II and III s/p banding. Patient had another EGD in 2021 that revealed moderately severe erosive/ulcerative reflux esophagitis. Esophageal varices grade II, s/p banding, small hh, portal hypertensive gastropathy.   He has continued to drink alcohol daily and failed to follow up with GI outpatient. Albumin 3.4, AST 63, T Bili 3, INR 1.3, hgb 13.4, FOBT positive. He denies any episodes of swelling to abdomen, LEs, any episodes of confusion. We discussed the severity of his disease and the imperativeness of close outpatient follow up as well as alcohol cessation. We will proceed with EGD for further evaluation of UGIB as patient could have bleeding varices vs esophagitis, given his history.   MELD:14 Child Pugh: B  He also had findings concerning of possible HCC on MRI in Jan 2021, he was supposed to follow with oncology for further evaluation, however was lost to follow up. He denies any weight loss recently. He will need oncology follow up for further evaluation of this.   Plan: SBP prophylaxis with rocephin 1g q24 Continue octreotide infusion Nadolol 20mg  daily Continue thiamine and folic acid Non specific beta blocker should be considered given hx of varices Monitor for signs of overt GI bleeding Monitor for signs of HE Trend INR daily Trend LFTs daily EGD tomorrow for  evaluation of UGIB Alcohol cessation  Close outpatient follow up with GI after discharge Oncology follow up for liver mass   LOS: 0 days    01/29/2021, 3:16 PM  Cody Miskell L. 03/31/2021, MSN, APRN, AGNP-C Adult-Gerontology Nurse Practitioner [facility]

## 2021-01-29 NOTE — H&P (Signed)
History and Physical  Cody [name] TLX:[id number] DOB: June 04, 1970 DOA: 01/29/2021   PCP: [facility]   Patient coming from: Home  Chief Complaint: hematemesis  HPI:  Cody [name] is a 50 y.o. male with medical history of alcoholic liver cirrhosis, hypertension, esophageal varices, GI bleed presenting with hematemesis that began on 01/27/2021.  The patient states that he has been drinking vodka on a daily basis.  He states that the hematemesis occurred for 4-5 episodes.  Since then, he has been "spitting up blood".  He denies any NSAIDs.  He denies any illegal drugs.  He has not had any fevers, chills, headache, neck pain, chest pain, shortness of breath, coughing, hemoptysis, diarrhea, abdominal pain.  He has had some melena. In the emergency department, the patient was afebrile and hemodynamically stable with oxygen saturation 99% room air BMP shows sodium 139, potassium 3.7, serum creatinine of 1.23.  WBC 10.2, hemoglobin 13.4, fluids 222,000.  Assessment/Plan: Upper GI bleed -Start Protonix drip -Start octreotide -Start ceftriaxone -Start IV fluids -N.p.o. after midnight for possible EGD -GI consult  Alcohol abuse -Alcohol withdrawal protocol  Essential hypertension -Holding amlodipine and metoprolol for now -Monitor blood pressure  Alcoholic liver cirrhosis -GI consult -INR 1.3  Hyperglycemia -Check hemoglobin A1c  Active Problems:   Upper GI bleed       Past Medical History:  Diagnosis Date   Hypertension    Past Surgical History:  Procedure Laterality Date   ESOPHAGEAL BANDING N/A 06/05/2019   Procedure: ESOPHAGEAL BANDING;  Surgeon: Corbin Ade, MD;  [facility];  Service: Endoscopy;  Laterality: N/A;   ESOPHAGOGASTRODUODENOSCOPY  02/10/2007   Dr. Jena Gauss; distal esophageal erosions consistent with reflux esophagitis, moderate sized hiatal hernia, submucosal gastric hemorrhage.   ESOPHAGOGASTRODUODENOSCOPY (EGD)  WITH PROPOFOL N/A 05/02/2019   Procedure: ESOPHAGOGASTRODUODENOSCOPY (EGD) WITH PROPOFOL;  Surgeon: West Bali, MD;  Location: [facility];  Service: Endoscopy;  Laterality: N/A;   ESOPHAGOGASTRODUODENOSCOPY (EGD) WITH PROPOFOL N/A 06/05/2019   Procedure: ESOPHAGOGASTRODUODENOSCOPY (EGD) WITH PROPOFOL;  Surgeon: Corbin Ade, MD;  Location: [facility];  Service: Endoscopy;  Laterality: N/A;  2:45pm - pt can not come earlier   IR RADIOLOGIST EVAL & MGMT  06/28/2019   TONSILLECTOMY  childhood   Social History:  reports that he quit smoking about 22 years ago. His smoking use included cigarettes. He has a 30.00 pack-year smoking history. He has never used smokeless tobacco. He reports current alcohol use. He reports that he does not use drugs.   Family History  Problem Relation Age of Onset   Colon cancer Neg Hx    Stomach cancer Neg Hx    Esophageal cancer Neg Hx      No Known Allergies   Prior to Admission medications   Medication Sig Start Date End Date Taking? Authorizing Provider  amLODipine (NORVASC) 5 MG tablet Take 5 mg by mouth daily.   Yes [provider]  metoprolol tartrate (LOPRESSOR) 50 MG tablet Take 50 mg by mouth 2 (two) times daily.   Yes [provider]  Multiple Vitamin (MULTIVITAMIN WITH MINERALS) TABS tablet Take 1 tablet by mouth daily. Patient not taking: Reported on 01/29/2021 05/05/19   Vassie Loll, MD  nadolol (CORGARD) 20 MG tablet Take 1 tablet (20 mg total) by mouth daily. Patient not taking: Reported on 01/29/2021 05/05/19   Vassie Loll, MD  pantoprazole (PROTONIX) 40 MG tablet Take 1 tab by mouth twice a day  for 1 month; than 1 tablet daily. Patient not taking: Reported on 01/29/2021 05/05/19   Vassie Loll, MD    Review of Systems:  Constitutional:  No weight loss, night sweats, Fevers, chills, fatigue.  Head&Eyes: No headache.  No vision loss.  No eye pain or scotoma ENT:  No Difficulty swallowing,Tooth/dental  problems,Sore throat,  No ear ache, post nasal drip,  Cardio-vascular:  No chest pain, Orthopnea, PND, swelling in lower extremities,  dizziness, palpitations  GI:  No  abdominal pain, diarrhea, loss of appetite, hematochezia, melena, heartburn, indigestion, Resp:  No shortness of breath with exertion or at rest. No cough. No coughing up of blood .No wheezing.No chest wall deformity  Skin:  no rash or lesions.  GU:  no dysuria, change in color of urine, no urgency or frequency. No flank pain.  Musculoskeletal:  No joint pain or swelling. No decreased range of motion. No back pain.  Psych:  No change in mood or affect. No depression or anxiety. Neurologic: No headache, no dysesthesia, no focal weakness, no vision loss. No syncope  Physical Exam: Vitals:   01/29/21 0906 01/29/21 1300 01/29/21 1330 01/29/21 1400  BP:  122/89 108/78 (!) 130/98  Pulse:  85 87 90  Resp:  18 18 18   Temp:      TempSrc:      SpO2:  99% 95% 99%  Weight: 85 kg     Height: 5\' 10"  (1.778 m)      General:  A&O x 3, NAD, nontoxic, pleasant/cooperative Head/Eye: No conjunctival hemorrhage, no icterus, [place]/AT, No nystagmus ENT:  No icterus,  No thrush, good dentition, no pharyngeal exudate Neck:  No masses, no lymphadenpathy, no bruits CV:  RRR, no rub, no gallop, no S3 Lung:  CTAB, good air movement, no wheeze, no rhonchi Abdomen: soft/NT, +BS, nondistended, no peritoneal signs Ext: No cyanosis, No rashes, No petechiae, No lymphangitis, No edema Neuro: CNII-XII intact, strength 4/5 in bilateral upper and lower extremities, no dysmetria  Labs on Admission:  Basic Metabolic Panel: Recent Labs  Lab 01/29/21 0921  NA 139  K 3.7  CL 107  CO2 23  GLUCOSE 163*  BUN 27*  CREATININE 1.23  CALCIUM 8.8*   Liver Function Tests: Recent Labs  Lab 01/29/21 0921  AST 63*  ALT 29  ALKPHOS 116  BILITOT 3.0*  PROT 8.0  ALBUMIN 3.4*   No results for input(s): LIPASE, AMYLASE in the last 168 hours. No  results for input(s): AMMONIA in the last 168 hours. CBC: Recent Labs  Lab 01/29/21 0921  WBC 10.2  HGB 13.4  HCT 39.7  MCV 91.3  PLT 222   Coagulation Profile: Recent Labs  Lab 01/29/21 0924  INR 1.3*   Cardiac Enzymes: No results for input(s): CKTOTAL, CKMB, CKMBINDEX, TROPONINI in the last 168 hours. BNP: Invalid input(s): POCBNP CBG: No results for input(s): GLUCAP in the last 168 hours. Urine analysis:    Component Value Date/Time   COLORURINE AMBER (A) 05/02/2019 0810   APPEARANCEUR CLEAR 05/02/2019 0810   LABSPEC 1.032 (H) 05/02/2019 0810   PHURINE 5.0 05/02/2019 0810   GLUCOSEU NEGATIVE 05/02/2019 0810   HGBUR NEGATIVE 05/02/2019 0810   BILIRUBINUR SMALL (A) 05/02/2019 0810   KETONESUR 20 (A) 05/02/2019 0810   PROTEINUR 30 (A) 05/02/2019 0810   NITRITE NEGATIVE 05/02/2019 0810   LEUKOCYTESUR NEGATIVE 05/02/2019 0810   Sepsis Labs: @LABRCNTIP (procalcitonin:4,lacticidven:4) )No results found for this or any previous visit (from the past 240 hour(s)).   Radiological Exams  on Admission: No results found.      Time spent:60 minutes Code Status:   FULL Family Communication:  No Family at bedside Disposition Plan: expect 2-3 day hospitalization Consults called: GI DVT Prophylaxis: SCDs  Catarina Hartshorn, DO  [facility] Pager [phone]  If 7PM-7AM, please contact night-coverage [email or web address] Password [email or web address] 01/29/2021, 3:08 PM

## 2021-01-29 NOTE — ED Provider Notes (Signed)
[facility] EMERGENCY DEPARTMENT Provider Note   CSN: [id number] Arrival date & time: 01/29/21  [id number]     History Chief Complaint  Patient presents with   Hematemesis    Cody [name] is a 50 y.o. male.  HPI  Patient with significant medical history of hypertension, esophagus varices, GI bleeds, alcohol abuse, presents to the emergency department with chief complaint of nausea and hematemesis.  Patient states on Monday he had 2 alcoholic beverages, states the following day Tuesday he developed nausea and vomiting, states he has been vomiting up bright red blood about 4-5 times yesterday.  States that this has improved since yesterday, has only been spitting up some blood today, he did note that he has melena in his stool, he states he feels slightly dizzy he goes from a sitting to standing position, he has no stomach pain at this time, states he just has nausea is unable to tolerate p.o.  He still passing flatus, having normal bowel movements denies NSAID use.  he has no other complaints at this time, does not endorse fevers, chills, chest pain, shortness of breath, abdominal pain, worsening pedal edema.  After reviewing patient's charts patient is being followed by Dr. Jena Gaussourk of GI it is noted that he had a upper endoscopy last year where he has noted varices and ulcers of the esophagus secondary due to esophagitis, also notes that he hepatic cirrhosis with  a liver mass concerning for hepatocellular carcinoma patient has not follow-up with oncology.  He is currently not on anticoagulant this time.  Past Medical History:  Diagnosis Date   Hypertension     Patient Active Problem List   Diagnosis Date Noted   Upper GI bleed 01/29/2021   Esophageal varices with bleeding in diseases classified elsewhere [facility](HCC)    Elevated liver enzymes    GI bleed 05/02/2019   Esophageal varices with bleeding (HCC) 05/02/2019   Alcohol abuse 05/02/2019   Hematemesis with nausea    GERD 07/20/2008    GENERALIZED ANXIETY DISORDER 11/03/2007   ALCOHOL USE 11/03/2007   Essential hypertension 11/03/2007    Past Surgical History:  Procedure Laterality Date   ESOPHAGEAL BANDING N/A 06/05/2019   Procedure: ESOPHAGEAL BANDING;  Surgeon: Corbin Adeourk, Robert M, MD;  [facility];  Service: Endoscopy;  Laterality: N/A;   ESOPHAGOGASTRODUODENOSCOPY  02/10/2007   Dr. Jena Gaussourk; distal esophageal erosions consistent with reflux esophagitis, moderate sized hiatal hernia, submucosal gastric hemorrhage.   ESOPHAGOGASTRODUODENOSCOPY (EGD) WITH PROPOFOL N/A 05/02/2019   Procedure: ESOPHAGOGASTRODUODENOSCOPY (EGD) WITH PROPOFOL;  Surgeon: West BaliFields, Sandi L, MD;  [facility]: [facility];  Service: Endoscopy;  Laterality: N/A;   ESOPHAGOGASTRODUODENOSCOPY (EGD) WITH PROPOFOL N/A 06/05/2019   Procedure: ESOPHAGOGASTRODUODENOSCOPY (EGD) WITH PROPOFOL;  Surgeon: Corbin Adeourk, Robert M, MD;  [facility]: [facility];  Service: Endoscopy;  Laterality: N/A;  2:45pm - pt can not come earlier   IR RADIOLOGIST EVAL & MGMT  06/28/2019   TONSILLECTOMY  childhood       Family History  Problem Relation Age of Onset   Colon cancer Neg Hx    Stomach cancer Neg Hx    Esophageal cancer Neg Hx     Social History   Tobacco Use   Smoking status: Former    Packs/day: 2.00    Years: 15.00    Pack years: 30.00    Types: Cigarettes    Quit date: 05/31/1998    Years since quitting: 22.6   Smokeless tobacco: Never  Vaping Use   Vaping Use:  Never used  Substance Use Topics   Alcohol use: Yes    Comment: 2 shots of vodka a day   Drug use: No    Home Medications Prior to Admission medications   Medication Sig Start Date End Date Taking? Authorizing Provider  amLODipine (NORVASC) 5 MG tablet Take 5 mg by mouth daily.   Yes [provider]  metoprolol tartrate (LOPRESSOR) 50 MG tablet Take 50 mg by mouth 2 (two) times daily.   Yes [provider]  Multiple Vitamin (MULTIVITAMIN WITH MINERALS) TABS tablet Take 1  tablet by mouth daily. Patient not taking: Reported on 01/29/2021 05/05/19   Vassie Loll, MD  nadolol (CORGARD) 20 MG tablet Take 1 tablet (20 mg total) by mouth daily. Patient not taking: Reported on 01/29/2021 05/05/19   Vassie Loll, MD  pantoprazole (PROTONIX) 40 MG tablet Take 1 tab by mouth twice a day for 1 month; than 1 tablet daily. Patient not taking: Reported on 01/29/2021 05/05/19   Vassie Loll, MD    Allergies    Patient has no known allergies.  Review of Systems   Review of Systems  Constitutional:  Negative for chills and fever.  HENT:  Negative for congestion.   Respiratory:  Negative for shortness of breath.   Cardiovascular:  Negative for chest pain.  Gastrointestinal:  Positive for blood in stool, nausea and vomiting. Negative for abdominal pain, constipation and diarrhea.  Genitourinary:  Negative for enuresis.  Musculoskeletal:  Negative for back pain.  Skin:  Negative for rash.  Neurological:  Positive for dizziness.  Hematological:  Does not bruise/bleed easily.   Physical Exam Updated Vital Signs BP (!) 130/98   Pulse 90   Temp 98.3 F (36.8 C) (Oral)   Resp 18   Ht 5\' 10"  (1.778 m)   Wt 85 kg   SpO2 99%   BMI 26.89 kg/m   Physical Exam Vitals and nursing note reviewed.  Constitutional:      General: He is not in acute distress.    Appearance: He is not ill-appearing.  HENT:     Head: Normocephalic and atraumatic.     Nose: No congestion.  Eyes:     General: No scleral icterus.    Conjunctiva/sclera: Conjunctivae normal.  Cardiovascular:     Rate and Rhythm: Normal rate and regular rhythm.     Pulses: Normal pulses.     Heart sounds: No murmur heard.   No friction rub. No gallop.  Pulmonary:     Effort: No respiratory distress.     Breath sounds: No wheezing, rhonchi or rales.  Abdominal:     Palpations: Abdomen is soft.     Tenderness: There is no abdominal tenderness. There is no right CVA tenderness or left CVA tenderness.      Comments: Abdomen nondistended, normal bowel sounds, dull to percussion, nontender to palpation, no guarding, rebound tenderness, peritoneal sign.  Skin:    General: Skin is warm and dry.  Neurological:     Mental Status: He is alert.  Psychiatric:        Mood and Affect: Mood normal.    ED Results / Procedures / Treatments   Labs (all labs ordered are listed, but only abnormal results are displayed) Labs Reviewed  COMPREHENSIVE METABOLIC PANEL - Abnormal; Notable for the following components:      Result Value   Glucose, Bld 163 (*)    BUN 27 (*)    Calcium 8.8 (*)    Albumin 3.4 (*)  AST 63 (*)    Total Bilirubin 3.0 (*)    All other components within normal limits  PROTIME-INR - Abnormal; Notable for the following components:   Prothrombin Time 15.9 (*)    INR 1.3 (*)    All other components within normal limits  POC OCCULT BLOOD, ED - Abnormal; Notable for the following components:   Fecal Occult Bld POSITIVE (*)    All other components within normal limits  RESP PANEL BY RT-PCR (FLU A&B, COVID) ARPGX2  CBC  ETHANOL  HIV ANTIBODY (ROUTINE TESTING W REFLEX)  TYPE AND SCREEN    EKG None  Radiology No results found.  Procedures Procedures   Medications Ordered in ED Medications  octreotide (SANDOSTATIN) 2 mcg/mL load via infusion 50 mcg (has no administration in time range)    And  octreotide (SANDOSTATIN) 500 mcg in sodium chloride 0.9 % 250 mL (2 mcg/mL) infusion (has no administration in time range)  acetaminophen (TYLENOL) tablet 650 mg (has no administration in time range)    Or  acetaminophen (TYLENOL) suppository 650 mg (has no administration in time range)  ondansetron (ZOFRAN) tablet 4 mg (has no administration in time range)    Or  ondansetron (ZOFRAN) injection 4 mg (has no administration in time range)  cefTRIAXone (ROCEPHIN) 1 g in sodium chloride 0.9 % 100 mL IVPB (has no administration in time range)  0.9 % NaCl with KCl 20 mEq/ L  infusion  (has no administration in time range)  LORazepam (ATIVAN) tablet 1-4 mg (has no administration in time range)    Or  LORazepam (ATIVAN) injection 1-4 mg (has no administration in time range)  thiamine tablet 100 mg (has no administration in time range)    Or  thiamine (B-1) injection 100 mg (has no administration in time range)  folic acid (FOLVITE) tablet 1 mg (has no administration in time range)  multivitamin with minerals tablet 1 tablet (has no administration in time range)  pantoprazole (PROTONIX) injection 40 mg (40 mg Intravenous Given 01/29/21 1536)    ED Course  I have reviewed the triage vital signs and the nursing notes.  Pertinent labs & imaging results that were available during my care of the patient were reviewed by me and considered in my medical decision making (see chart for details).    MDM Rules/Calculators/A&P                          Initial impression-patient presents with hematemesis and nausea.  He is alert, does not appear acute distress  vital signs reassuring will obtain basic lab work-up and reassess.  Work-up-CBC unremarkable, CMP shows hyperglycemia 163, elevated BUN 27, calcium 8.8, slightly elevated AST of 63, T bili 3.0, INR 1.3, troponin time 15.9, Hemoccult positive, ethanol less than 10.  Reassessment-patient is tolerating p.o. without difficulty, vital signs remained stable, due to his positive Hemoccult with history of varices will consult with GI for further recommendations.  Updated patient on recommendations from GI he is in agreement this plan, will consult with hospitalist for admission.  Consult-spoke with Dr. Marline Backbone of GI he recommends due to patient's significant comorbidities i.e. varices and hematemesis recommends admission for further evaluation.  Admit to the hospitalist team, make him n.p.o. by midnight and his team will assess in the morning.  Spoke with Dr Tat will admit the patient.   Rule out-I have low suspicion for emergent  blood transfusion at this time as he is hemodynamically stable,  last hemoglobin was 13.4 he is not actively having hematemesis at this time.  I have low suspicion for perforated stomach ulcer as abdomen nontender to palpation, no peritoneal sign present my exam.  I have low suspicion for boerhaaves as patient is not actively vomiting at this time, he is nontoxic-appearing, vital signs reassuring.  Low suspicion for severe hepatic coagulopathy as his prothrombin time as well as INR only mildly elevated, no pancytopenia presents, no abnormal bruising present my exam.  Plan-admit patient hospitalist team for likely upper GI bleed with GI follow-up.  Final Clinical Impression(s) / ED Diagnoses Final diagnoses:  Upper GI bleed    Rx / DC Orders ED Discharge Orders     None        Carroll Sage, PA-C 01/29/21 1545    Bethann Berkshire, MD 02/01/21 1209

## 2021-01-29 NOTE — Consult Note (Signed)
Referring Provider: No ref. provider found Primary Care Physician:  [facility] Primary Gastroenterologist:  Dr. Jena Gauss  Date of Admission: 01/29/21 Date of Consultation: 01/29/21  Reason for Consultation:  Hematemesis/melena  HPI:  Cody [name] is a 50 y.o. year old male with pmh of HTN and daily alcohol abuse. Presented to the ED today with hematemesis since yesterday morning as well as some rectal bleeding. Hx of the same 2 years ago. He states he drank alcohol 2 days ago and woke up yesterday vomiting blood with 4-5 reported episodes yesterday. He also noted some melena in stools, reports some dizziness and continued nausea today.   ED Course: BUN 27, Album 3.4, AST 63, T Bili 3, INR 1.3, hgb 13.4, FOBT positive.   Consult: Patient was previously followed By Dr. Jena Gauss, he had similar presentation 2 years ago, presenting with hematemesis, he had an EGD at that time that revealed Grade II and Grade III varices with stigmata of bleeding, eradicated, banded, portal hypertensive gastropathy and mild gastritis.   EGD again last year with noted varices and ulcers of esophagus secondary to esophagitis, s/p banding again, hx of cirrhosis with a liver mass concerning for hepatocellular carcinoma without oncology follow up. No current anticoagulation.   He states that he drank 24 oz of wine and a shot of vodka on Monday night due to the holiday. He woke up on Tuesday morning with nausea and hematemesis. He drank water and stated he felt a little better by bedtime. He reports that this morning he had more nausea hematemesis with some blood clots as well as an episode of black diarrhea. He denies any abdominal pain. No episodes of BRBPR. He denies swelling to his abdomen or LEs, he does endorse some dizziness and some fatigue.   He reports that he drinks 1 shot of liquor every evening. He denies any recent weight loss, denies any issues with his appetite or early satiety. Denies  NSAID use. Denies tobacco use.   He was lost to follow with [facility] on outpatient basis for further management of his cirrhosis.   Cirrhosis related questions: Episodes of confusion/disorientation: no Taking diuretics? no Beta blockers? Nadolol 20mg  daily Prior history of variceal banding? yes Prior episodes of SBP? no Last liver imaging: MRI 05/2019, 06/2019 Abdomen Complete 04/2021 Alcohol use: yes  Previous labs: Hep A antibody reactive, Hep B surface antigen non reactive, HCV Ab non reactive, Hep B S Ab nonreactive  MRI abdomen: (06/06/19) hepatic cirrhosis with at least 1 mass in R lobe measuring 3.2 cm, indeterminate characteristics, hepatocellular carcinoma cannot be excluded in setting of cirrhosis  EGD: 1/11/21Moderately severe erosive/ulcerative reflux esophagitis. Esophageal varices, 3 colums of grade II varices.Status Post esophageal band ligation - Small hiatal hernia. - Portal hypertensive gastropathy. - Normal duodenal bulb and second portion of the duodenum. - No specimens collected. Colonoscopy: never  08/03/19 Abd Complete: (05/02/20) mildly increased echogenicity of hepatic parenchyma is noted suggesting hepatic steatosis or other diffuse hepatocellular disease. Minimal para patent ascites is noted in region of gallbladder fossa.   Past Medical History:  Diagnosis Date   Hypertension     Past Surgical History:  Procedure Laterality Date   ESOPHAGEAL BANDING N/A 06/05/2019   Procedure: ESOPHAGEAL BANDING;  Surgeon: 08/03/2019, MD;  [facility]: [facility];  Service: Endoscopy;  Laterality: N/A;   ESOPHAGOGASTRODUODENOSCOPY  02/10/2007   Dr. 02/12/2007; distal esophageal erosions consistent with reflux esophagitis, moderate sized hiatal hernia, submucosal gastric hemorrhage.   ESOPHAGOGASTRODUODENOSCOPY (  EGD) WITH PROPOFOL N/A 05/02/2019   Procedure: ESOPHAGOGASTRODUODENOSCOPY (EGD) WITH PROPOFOL;  Surgeon: West Bali, MD;  Location: [facility];  Service: Endoscopy;   Laterality: N/A;   ESOPHAGOGASTRODUODENOSCOPY (EGD) WITH PROPOFOL N/A 06/05/2019   Procedure: ESOPHAGOGASTRODUODENOSCOPY (EGD) WITH PROPOFOL;  Surgeon: Corbin Ade, MD;  Location: [facility];  Service: Endoscopy;  Laterality: N/A;  2:45pm - pt can not come earlier   IR RADIOLOGIST EVAL & MGMT  06/28/2019   TONSILLECTOMY  childhood    Prior to Admission medications   Medication Sig Start Date End Date Taking? Authorizing Provider  amLODipine (NORVASC) 5 MG tablet Take 5 mg by mouth daily.   Yes [provider]  metoprolol tartrate (LOPRESSOR) 50 MG tablet Take 50 mg by mouth 2 (two) times daily.   Yes [provider]  Multiple Vitamin (MULTIVITAMIN WITH MINERALS) TABS tablet Take 1 tablet by mouth daily. Patient not taking: Reported on 01/29/2021 05/05/19   Vassie Loll, MD  nadolol (CORGARD) 20 MG tablet Take 1 tablet (20 mg total) by mouth daily. Patient not taking: Reported on 01/29/2021 05/05/19   Vassie Loll, MD  pantoprazole (PROTONIX) 40 MG tablet Take 1 tab by mouth twice a day for 1 month; than 1 tablet daily. Patient not taking: Reported on 01/29/2021 05/05/19   Vassie Loll, MD    Current Facility-Administered Medications  Medication Dose Route Frequency Provider Last Rate Last Admin   octreotide (SANDOSTATIN) 2 mcg/mL load via infusion 50 mcg  50 mcg Intravenous Once Tat, David, MD       And   octreotide (SANDOSTATIN) 500 mcg in sodium chloride 0.9 % 250 mL (2 mcg/mL) infusion  50 mcg/hr Intravenous Continuous Tat, David, MD       pantoprazole (PROTONIX) injection 40 mg  40 mg Intravenous Once Carroll Sage, PA-C       Current Outpatient Medications  Medication Sig Dispense Refill   amLODipine (NORVASC) 5 MG tablet Take 5 mg by mouth daily.     metoprolol tartrate (LOPRESSOR) 50 MG tablet Take 50 mg by mouth 2 (two) times daily.     Multiple Vitamin (MULTIVITAMIN WITH MINERALS) TABS tablet Take 1 tablet by mouth daily. (Patient not taking:  Reported on 01/29/2021) 30 tablet 3   nadolol (CORGARD) 20 MG tablet Take 1 tablet (20 mg total) by mouth daily. (Patient not taking: Reported on 01/29/2021) 30 tablet 1   pantoprazole (PROTONIX) 40 MG tablet Take 1 tab by mouth twice a day for 1 month; than 1 tablet daily. (Patient not taking: Reported on 01/29/2021) 60 tablet 1    Allergies as of 01/29/2021   (No Known Allergies)    Family History  Problem Relation Age of Onset   Colon cancer Neg Hx    Stomach cancer Neg Hx    Esophageal cancer Neg Hx     Social History   Socioeconomic History   Marital status: Single    Spouse name: Not on file   Number of children: Not on file   Years of education: Not on file   Highest education level: Not on file  Occupational History   Not on file  Tobacco Use   Smoking status: Former    Packs/day: 2.00    Years: 15.00    Pack years: 30.00    Types: Cigarettes    Quit date: 05/31/1998    Years since quitting: 22.6   Smokeless tobacco: Never  Vaping Use   Vaping Use: Never used  Substance and  Sexual Activity   Alcohol use: Yes    Comment: 2 shots of vodka a day   Drug use: No   Sexual activity: Yes  Other Topics Concern   Not on file  Social History Narrative   Not on file   Social Determinants of Health   Financial Resource Strain: Not on file  Food Insecurity: Not on file  Transportation Needs: Not on file  Physical Activity: Not on file  Stress: Not on file  Social Connections: Not on file  Intimate Partner Violence: Not on file    Review of Systems: Gen: Denies fever, chills, loss of appetite, change in weight or weight loss CV: Denies chest pain, heart palpitations, syncope, edema  Resp: Denies shortness of breath with rest, cough, wheezing GI: Denies dysphagia or odynophagia. +hematemesis, +melena, GU : Denies urinary burning, urinary frequency, urinary incontinence.  MS: Denies joint pain,swelling, cramping Derm: Denies rash, itching, dry skin Psych: Denies  depression, anxiety,confusion, or memory loss Heme: Denies bruising, bleeding, and enlarged lymph nodes.  Physical Exam: Vital signs in last 24 hours: Temp:  [98.3 F (36.8 C)] 98.3 F (36.8 C) (09/07 0905) Pulse Rate:  [81-90] 90 (09/07 1400) Resp:  [16-18] 18 (09/07 1400) BP: (108-130)/(78-98) 130/98 (09/07 1400) SpO2:  [95 %-99 %] 99 % (09/07 1400) Weight:  [85 kg] 85 kg (09/07 0906)   General:   Alert,  Well-developed, well-nourished, pleasant and cooperative in NAD Head:  Normocephalic and atraumatic. Eyes:  Sclera clear, no icterus.   Conjunctiva pink. Ears:  Normal auditory acuity. Nose:  No deformity, discharge,  or lesions. Mouth:  No deformity or lesions, dentition normal. Lungs:  Clear throughout to auscultation.   No wheezes, crackles, or rhonchi. No acute distress. Heart:  Regular rate and rhythm; no murmurs, clicks, rubs,  or gallops. Abdomen:  Soft, nontender, mildly distended no tense ascites. No masses, hepatosplenomegaly or hernias noted. Normal bowel sounds, without guarding, and without rebound.   Rectal:  Deferred until time of colonoscopy.   Msk:  Symmetrical without gross deformities. Normal posture. Pulses:  Normal pulses noted. Extremities:  Without clubbing or edema. Neurologic:  Alert and  oriented x4;  grossly normal neurologically. No asterixis Skin:  Intact without significant lesions or rashes. Psych:  Alert and cooperative. Normal mood and affect.   Lab Results: Recent Labs    01/29/21 0921  WBC 10.2  HGB 13.4  HCT 39.7  PLT 222   BMET Recent Labs    01/29/21 0921  NA 139  K 3.7  CL 107  CO2 23  GLUCOSE 163*  BUN 27*  CREATININE 1.23  CALCIUM 8.8*   LFT Recent Labs    01/29/21 0921  PROT 8.0  ALBUMIN 3.4*  AST 63*  ALT 29  ALKPHOS 116  BILITOT 3.0*   PT/INR Recent Labs    01/29/21 0924  LABPROT 15.9*  INR 1.3*    Studies/Results: No results found.  Impression: Cody [name] is a 50 year old male with  history of alcoholic cirrhosis, esophageal varices who presented to ED today after having hematemesis starting Tuesday morning. Patient reports that he drank 24 oz of wine and 1 shot of vodka Monday night, (typically has one shot every evening), states that Tuesday he woke up with nausea and vomiting bright red blood with clots. He drank water and felt better by bedtime, however, he woke up this morning and had hematemesis again as well as an episode of melena. He denies any abdominal pain.   Cirrhosis:  secondary to ETOH abuse. Diagnosed in 2020 after he presented to ED for hematemesis, EGD revealed variceal bleed, grade II and III s/p banding. Patient had another EGD in 2021 that revealed moderately severe erosive/ulcerative reflux esophagitis. Esophageal varices grade II, s/p banding, small hh, portal hypertensive gastropathy.   He has continued to drink alcohol daily and failed to follow up with GI outpatient. Albumin 3.4, AST 63, T Bili 3, INR 1.3, hgb 13.4, FOBT positive. He denies any episodes of swelling to abdomen, LEs, any episodes of confusion. We discussed the severity of his disease and the imperativeness of close outpatient follow up as well as alcohol cessation. We will proceed with EGD for further evaluation of UGIB as patient could have bleeding varices vs esophagitis, given his history.   MELD:14 Child Pugh: B  He also had findings concerning of possible HCC on MRI in Jan 2021, he was supposed to follow with oncology for further evaluation, however was lost to follow up. He denies any weight loss recently. He will need oncology follow up for further evaluation of this.   Plan: SBP prophylaxis with rocephin 1g q24 Continue octreotide infusion Nadolol 20mg  daily Continue thiamine and folic acid Non specific beta blocker should be considered given hx of varices Monitor for signs of overt GI bleeding Monitor for signs of HE Trend INR daily Trend LFTs daily EGD tomorrow for  evaluation of UGIB Alcohol cessation  Close outpatient follow up with GI after discharge Oncology follow up for liver mass   LOS: 0 days    01/29/2021, 3:16 PM  Cody Fesperman L. 03/31/2021, MSN, APRN, AGNP-C Adult-Gerontology Nurse Practitioner [facility]

## 2021-01-29 NOTE — ED Triage Notes (Signed)
Pt presents to ED with complaints of vomiting blood since yesterday am. Pt states he has also had some rectal bleeding. Pt states he had this happen 2 years ago also. Pt states he drank on 2 days ago, then woke up the next morning vomiting blood.

## 2021-01-30 ENCOUNTER — Encounter (HOSPITAL_COMMUNITY)
Admission: EM | Disposition: A | Discharge status: Home / Self Care | Payer: Self-pay | Source: Home / Self Care | Attending: Internal Medicine

## 2021-01-30 ENCOUNTER — Encounter (HOSPITAL_COMMUNITY): Payer: Self-pay | Admitting: Internal Medicine

## 2021-01-30 ENCOUNTER — Inpatient Hospital Stay (HOSPITAL_COMMUNITY): Payer: Self-pay

## 2021-01-30 ENCOUNTER — Inpatient Hospital Stay (HOSPITAL_COMMUNITY): Payer: Self-pay | Admitting: Anesthesiology

## 2021-01-30 DIAGNOSIS — F1010 Alcohol abuse, uncomplicated: Secondary | ICD-10-CM

## 2021-01-30 DIAGNOSIS — K766 Portal hypertension: Secondary | ICD-10-CM

## 2021-01-30 DIAGNOSIS — I8510 Secondary esophageal varices without bleeding: Secondary | ICD-10-CM

## 2021-01-30 DIAGNOSIS — K2210 Ulcer of esophagus without bleeding: Secondary | ICD-10-CM

## 2021-01-30 HISTORY — PX: ESOPHAGOGASTRODUODENOSCOPY (EGD) WITH PROPOFOL: SHX5813

## 2021-01-30 LAB — CBC
HCT: 33.9 % — ABNORMAL LOW (ref 39.0–52.0)
Hemoglobin: 11.5 g/dL — ABNORMAL LOW (ref 13.0–17.0)
MCH: 30.6 pg (ref 26.0–34.0)
MCHC: 33.9 g/dL (ref 30.0–36.0)
MCV: 90.2 fL (ref 80.0–100.0)
Platelets: 176 10*3/uL (ref 150–400)
RBC: 3.76 MIL/uL — ABNORMAL LOW (ref 4.22–5.81)
RDW: 14.6 % (ref 11.5–15.5)
WBC: 11.9 10*3/uL — ABNORMAL HIGH (ref 4.0–10.5)
nRBC: 0 % (ref 0.0–0.2)

## 2021-01-30 LAB — COMPREHENSIVE METABOLIC PANEL
ALT: 22 U/L (ref 0–44)
AST: 51 U/L — ABNORMAL HIGH (ref 15–41)
Albumin: 3 g/dL — ABNORMAL LOW (ref 3.5–5.0)
Alkaline Phosphatase: 92 U/L (ref 38–126)
Anion gap: 8 (ref 5–15)
BUN: 35 mg/dL — ABNORMAL HIGH (ref 6–20)
CO2: 22 mmol/L (ref 22–32)
Calcium: 8.4 mg/dL — ABNORMAL LOW (ref 8.9–10.3)
Chloride: 110 mmol/L (ref 98–111)
Creatinine, Ser: 1.17 mg/dL (ref 0.61–1.24)
GFR, Estimated: >60 mL/min (ref >60)
Glucose, Bld: 108 mg/dL — ABNORMAL HIGH (ref 70–99)
Potassium: 3.9 mmol/L (ref 3.5–5.1)
Sodium: 140 mmol/L (ref 135–145)
Total Bilirubin: 3 mg/dL — ABNORMAL HIGH (ref 0.3–1.2)
Total Protein: 7.1 g/dL (ref 6.5–8.1)

## 2021-01-30 LAB — PROTIME-INR
INR: 1.3 — ABNORMAL HIGH (ref 0.8–1.2)
Prothrombin Time: 16.1 seconds — ABNORMAL HIGH (ref 11.4–15.2)

## 2021-01-30 SURGERY — ESOPHAGOGASTRODUODENOSCOPY (EGD) WITH PROPOFOL
Anesthesia: Choice

## 2021-01-30 SURGERY — ESOPHAGOGASTRODUODENOSCOPY (EGD) WITH PROPOFOL
Anesthesia: General

## 2021-01-30 MED ORDER — ACETAMINOPHEN 500 MG PO TABS: 500.0000 mg | ORAL_TABLET | Freq: Four times a day (QID) | ORAL | Status: DC | PRN

## 2021-01-30 MED ORDER — STERILE WATER FOR IRRIGATION IR SOLN
Status: DC | PRN
  Administered 2021-01-30: 100 mL

## 2021-01-30 MED ORDER — GADOBUTROL 1 MMOL/ML IV SOLN
7.5000 mL | Freq: Once | INTRAVENOUS | Status: AC | PRN
  Administered 2021-01-30: 7.5 mL via INTRAVENOUS

## 2021-01-30 MED ORDER — SODIUM CHLORIDE 0.9 % IV SOLN: INTRAVENOUS | Status: DC

## 2021-01-30 MED ORDER — LACTATED RINGERS IV SOLN: INTRAVENOUS | Status: DC

## 2021-01-30 MED ORDER — LIDOCAINE HCL (CARDIAC) PF 100 MG/5ML IV SOSY
PREFILLED_SYRINGE | INTRAVENOUS | Status: DC | PRN
  Administered 2021-01-30: 50 mg via INTRAVENOUS

## 2021-01-30 MED ORDER — PROPOFOL 10 MG/ML IV BOLUS
INTRAVENOUS | Status: DC | PRN
  Administered 2021-01-30: 140 mg via INTRAVENOUS
  Administered 2021-01-30: 50 mg via INTRAVENOUS
  Administered 2021-01-30: 60 mg via INTRAVENOUS

## 2021-01-30 MED ORDER — DEXMEDETOMIDINE (PRECEDEX) IN NS 20 MCG/5ML (4 MCG/ML) IV SYRINGE
PREFILLED_SYRINGE | INTRAVENOUS | Status: DC | PRN
  Administered 2021-01-30 (×4): 10 ug via INTRAVENOUS

## 2021-01-30 MED ORDER — KETAMINE HCL 10 MG/ML IJ SOLN
INTRAMUSCULAR | Status: DC | PRN
  Administered 2021-01-30: 20 mg via INTRAVENOUS
  Administered 2021-01-30: 30 mg via INTRAVENOUS

## 2021-01-30 MED ORDER — KETAMINE HCL 50 MG/5ML IJ SOSY
PREFILLED_SYRINGE | INTRAMUSCULAR | Status: AC
  Filled 2021-01-30: qty 5

## 2021-01-30 NOTE — Anesthesia Preprocedure Evaluation (Signed)
Anesthesia Evaluation  ?Patient identified by MRN, date of birth, ID band ?Patient awake ? ? ? ?Reviewed: ?Allergy & Precautions, H&P , NPO status , Patient's Chart, lab work & pertinent test results, reviewed documented beta blocker date and time  ? ?Airway ?Mallampati: II ? ?TM Distance: >3 FB ?Neck ROM: full ? ? ? Dental ?no notable dental hx. ? ?  ?Pulmonary ?neg pulmonary ROS, former smoker,  ?  ?Pulmonary exam normal ?breath sounds clear to auscultation ? ? ? ? ? ? Cardiovascular ?Exercise Tolerance: Good ?hypertension, negative cardio ROS ? ? ?Rhythm:regular Rate:Normal ? ? ?  ?Neuro/Psych ?PSYCHIATRIC DISORDERS Anxiety negative neurological ROS ?   ? GI/Hepatic ?Neg liver ROS, GERD  Medicated,  ?Endo/Other  ?negative endocrine ROS ? Renal/GU ?negative Renal ROS  ?negative genitourinary ?  ?Musculoskeletal ? ? Abdominal ?  ?Peds ? Hematology ?negative hematology ROS ?(+)   ?Anesthesia Other Findings ? ? Reproductive/Obstetrics ?negative OB ROS ? ?  ? ? ? ? ? ? ? ? ? ? ? ? ? ?  ?  ? ? ? ? ? ? ? ? ?Anesthesia Physical ?Anesthesia Plan ? ?ASA: 2 ? ?Anesthesia Plan: General  ? ?Post-op Pain Management:   ? ?Induction:  ? ?PONV Risk Score and Plan: Propofol infusion ? ?Airway Management Planned:  ? ?Additional Equipment:  ? ?Intra-op Plan:  ? ?Post-operative Plan:  ? ?Informed Consent: I have reviewed the patients History and Physical, chart, labs and discussed the procedure including the risks, benefits and alternatives for the proposed anesthesia with the patient or authorized representative who has indicated his/her understanding and acceptance.  ? ? ? ?Dental Advisory Given ? ?Plan Discussed with: CRNA ? ?Anesthesia Plan Comments:   ? ? ? ? ? ? ?Anesthesia Quick Evaluation ? ?

## 2021-01-30 NOTE — Anesthesia Procedure Notes (Signed)
Date/Time: 01/30/2021 12:33 PM Performed by: Julian Reil, CRNA Pre-anesthesia Checklist: Emergency Drugs available, Patient identified, Suction available and Patient being monitored Patient Re-evaluated:Patient Re-evaluated prior to induction Oxygen Delivery Method: Nasal cannula Induction Type: IV induction Placement Confirmation: positive ETCO2

## 2021-01-30 NOTE — Interval H&P Note (Signed)
History and Physical Interval Note:  01/30/2021 11:44 AM  Cody [name]  has presented today for surgery, with the diagnosis of Upper GI bleed in patient with known cirrhosis..  The various methods of treatment have been discussed with the patient and family. After consideration of risks, benefits and other options for treatment, the patient has consented to  Procedure(s): ESOPHAGOGASTRODUODENOSCOPY (EGD) WITH PROPOFOL (N/A) ESOPHAGEAL BANDING (N/A) as a surgical intervention.  The patient's history has been reviewed, patient examined, no change in status, stable for surgery.  I have reviewed the patient's chart and labs.  Questions were answered to the patient's satisfaction.     Lanelle Bal

## 2021-01-30 NOTE — Transfer of Care (Signed)
Immediate Anesthesia Transfer of Care Note  Patient: Cody [name]  Procedure(s) Performed: ESOPHAGOGASTRODUODENOSCOPY (EGD) WITH PROPOFOL  Patient Location: PACU  Anesthesia Type:General  Level of Consciousness: drowsy  Airway & Oxygen Therapy: Patient Spontanous Breathing and Patient connected to nasal cannula oxygen  Post-op Assessment: Report given to RN and Post -op Vital signs reviewed and stable  Post vital signs: Reviewed and stable  Last Vitals:  Vitals Value Taken Time  BP    Temp    Pulse 80 01/30/21 1241  Resp 19 01/30/21 1241  SpO2 99 % 01/30/21 1241  Vitals shown include unvalidated device data.  Last Pain:  Vitals:   01/30/21 1225  TempSrc:   PainSc: 0-No pain      Patients Stated Pain Goal: 5 (01/30/21 1211)  Complications: No notable events documented.

## 2021-01-30 NOTE — Op Note (Signed)
[facility] Patient Name: Cody [name] Procedure Date: 01/30/2021 11:23 AM MRN: [id number] Date of Birth: [birth year] Attending MD: Elon Alas. Abbey Chatters DO CSN: [id number] Age: 50 Admit Type: Outpatient Procedure:                Upper GI endoscopy Indications:              Hematemesis Providers:                Elon Alas. Abbey Chatters, DO, Caprice Kluver, Aram Candela Referring MD:              Medicines:                See the Anesthesia note for documentation of the                            administered medications Complications:            No immediate complications. Estimated Blood Loss:     Estimated blood loss: none. Procedure:                Pre-Anesthesia Assessment:                           - The anesthesia plan was to use monitored                            anesthesia care (MAC).                           After obtaining informed consent, the endoscope was                            passed under direct vision. Throughout the                            procedure, the patient's blood pressure, pulse, and                            oxygen saturations were monitored continuously. The                            [id number]-[id number] ([id number]) scope was introduced through the                            mouth, and advanced to the second part of duodenum.                            The upper GI endoscopy was accomplished without                            difficulty. The patient tolerated the procedure                            well. Scope In: 12:30:38 PM Scope Out: 12:34:38 PM Total Procedure Duration: 0 hours 4 minutes 0 seconds  Findings:      Three columns of grade I varices with no bleeding and no stigmata  of       recent bleeding were found in the lower third of the esophagus,. No red       wale signs were present. Scarring from prior treatment was visible.       These completely flattened with insufflation.      One linear esophageal ulcer with no bleeding and no stigmata of recent        bleeding was found in the distal esophagus distal to the longest varix.      Moderate portal hypertensive gastropathy was found in the entire       examined stomach.      Localized mildly erythematous mucosa without active bleeding and with no       stigmata of bleeding was found in the duodenal bulb and in the first       portion of the duodenum. Impression:               - Grade I esophageal varices with no bleeding and                            no stigmata of recent bleeding.                           - Esophageal ulcer with no bleeding and no stigmata                            of recent bleeding.                           - Portal hypertensive gastropathy.                           - Erythematous duodenopathy.                           - No specimens collected. Moderate Sedation:      Per Anesthesia Care Recommendation:           - Return patient to hospital ward for ongoing care.                           - Patient with 3 columns of grade 1 esophageal                            varices which completely flattened with                            insufflation. Scarring noted from previous banding.                            No bleeding or stigmata of bleeding. At the distal                            end of the longest varix, 1 superficial linear                            ulcer noted. No stigmata or active bleeding. Also  PHG noted throughout the entire stomach.                           - Recommend returning patient to hospital ward for                            ongoing care. Continue on IV PPI. Okay to                            discontinue octreotide once bag is empty. He will                            need aggressive twice daily PPI therapy with likely                            repeat EGD in 8 to 12 weeks. Avoid NSAIDs. Avoid                            alcohol use. Okay to start on a liquid diet today.                            GI to continue to  follow. Procedure Code(s):        --- Professional ---                           [phone], Esophagogastroduodenoscopy, flexible,                            transoral; diagnostic, including collection of                            specimen(s) by brushing or washing, when performed                            (separate procedure) Diagnosis Code(s):        --- Professional ---                           I85.00, Esophageal varices without bleeding                           K22.10, Ulcer of esophagus without bleeding                           K76.6, Portal hypertension                           K31.89, Other diseases of stomach and duodenum                           K92.0, Hematemesis CPT copyright 2019 [employer]. All rights reserved. The codes documented in this report are preliminary and upon coder review may  be revised to meet current compliance requirements. Elon Alas. Abbey Chatters, DO [place] Abbey Chatters, DO 01/30/2021 12:40:57 PM This report has been signed electronically.  Number of Addenda: 0 

## 2021-01-30 NOTE — Progress Notes (Signed)
Cody [name]  GOT:[id number] DOB: [birth year] DOA: 01/29/2021 PCP: Health, [facility]    Brief Narrative:  50 year old with a history of alcoholic cirrhosis complicated by esophageal varices requiring previous banding x2, LI-RADS cat 3 liver lesion not previously worked up due to noncompliance, and HTN who presented to the ED with hematemesis which have been occurring since 01/27/2021.  He admitted to drinking vodka on a daily basis.  In the ED he was hemodynamically stable with a hemoglobin of 13.4.   Consultants:  Gastroenterology  Code Status: FULL CODE  Antimicrobials:  None  DVT prophylaxis: SCDs  Subjective: Afebrile.  Vital signs stable.  Resting comfortably at the time of my visit.  Tells me he is committed to stopping drinking.  Agrees to EGD.  Denies chest pain nausea or vomiting.  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   Difficult to place patient No    Objective: Blood pressure 102/71, pulse 80, temperature 98.3 F (36.8 C), temperature source Oral, resp. rate 18, height 5\' 10"  (1.778 m), weight 85 kg, SpO2 96 %.  Intake/Output Summary (Last 24 hours) at 01/30/2021 1042 Last data filed at 01/29/2021 1645 Gross per 24 hour  Intake 100 ml  Output --  Net 100 ml   Filed Weights   01/29/21  0906  Weight: 85 kg    Examination: General: No acute respiratory distress Lungs: Clear to auscultation bilaterally without wheezes or crackles Cardiovascular: Regular rate and rhythm without murmur gallop or rub normal S1 and S2 Abdomen: Nontender, nondistended, soft, bowel sounds positive, no rebound, no ascites, no appreciable mass Extremities: No significant cyanosis, clubbing, or edema bilateral lower extremities  CBC: Recent Labs  Lab 01/29/21 0921 01/30/21 0425  WBC 10.2 11.9*  HGB 13.4 11.5*  HCT 39.7 33.9*  MCV 91.3 90.2  PLT 222 176   Basic Metabolic Panel: Recent Labs  Lab 01/29/21 0921 01/30/21 0425  NA 139 140  K 3.7 3.9  CL 107 110  CO2 23 22  GLUCOSE 163* 108*  BUN 27* 35*  CREATININE 1.23 1.17  CALCIUM 8.8* 8.4*   GFR: Estimated Creatinine Clearance: 78 mL/min (by C-G formula based on SCr of 1.17 mg/dL).  Liver Function Tests: Recent Labs  Lab 01/29/21 0921 01/30/21 0425  AST 63* 51*  ALT 29 22  ALKPHOS 116 92  BILITOT 3.0* 3.0*  PROT 8.0 7.1  ALBUMIN 3.4* 3.0*    Coagulation Profile: Recent Labs  Lab 01/29/21 0924 01/30/21 0425  INR 1.3* 1.3*     HbA1C: Hgb A1c MFr Bld  Date/Time Value Ref Range Status  05/02/2019 09:44 PM 6.1 (H) 4.8 - 5.6 % Final    Comment:    (NOTE) Pre diabetes:  5.7%-6.4% Diabetes:              >6.4% Glycemic control for   <7.0% adults with diabetes      Recent Results (from the past 240 hour(s))  Resp Panel by RT-PCR (Flu A&B, Covid) Nasopharyngeal Swab     Status: None   Collection Time: 01/29/21  3:55 PM   Specimen: Nasopharyngeal Swab; Nasopharyngeal(NP) swabs in vial transport medium  Result Value Ref Range Status   SARS Coronavirus 2 by RT PCR NEGATIVE NEGATIVE Final    Comment: (NOTE) SARS-CoV-2 target nucleic acids are NOT DETECTED.  The SARS-CoV-2 RNA is generally detectable in upper respiratory specimens during the acute phase of infection. The lowest concentration of  SARS-CoV-2 viral copies this assay can detect is 138 copies/mL. A negative result does not preclude SARS-Cov-2 infection and should not be used as the sole basis for treatment or other patient management decisions. A negative result may occur with  improper specimen collection/handling, submission of specimen other than nasopharyngeal swab, presence of viral mutation(s) within the areas targeted by this assay, and inadequate number of viral copies(<138 copies/mL). A negative result must be combined with clinical observations, patient history, and epidemiological information. The expected result is Negative.  Fact Sheet for Patients:  BloggerCourse.com  Fact Sheet for Healthcare Providers:  SeriousBroker.it  This test is no t yet approved or cleared by the [place] FDA and  has been authorized for detection and/or diagnosis of SARS-CoV-2 by FDA under an Emergency Use Authorization (EUA). This EUA will remain  in effect (meaning this test can be used) for the duration of the COVID-19 declaration under Section 564(b)(1) of the Act, 21 U.S.C.section 360bbb-3(b)(1), unless the authorization is terminated  or revoked sooner.       Influenza A by PCR NEGATIVE NEGATIVE Final   Influenza B by PCR NEGATIVE NEGATIVE Final    Comment: (NOTE) The Xpert Xpress SARS-CoV-2/FLU/RSV plus assay is intended as an aid in the diagnosis of influenza from Nasopharyngeal swab specimens and should not be used as a sole basis for treatment. Nasal washings and aspirates are unacceptable for Xpert Xpress SARS-CoV-2/FLU/RSV testing.  Fact Sheet for Patients: BloggerCourse.com  Fact Sheet for Healthcare Providers: SeriousBroker.it  This test is not yet approved or cleared by the [place] FDA and has been authorized for detection and/or diagnosis of SARS-CoV-2 by FDA under an Emergency Use  Authorization (EUA). This EUA will remain in effect (meaning this test can be used) for the duration of the COVID-19 declaration under Section 564(b)(1) of the Act, 21 U.S.C. section 360bbb-3(b)(1), unless the authorization is terminated or revoked.  Performed at [facility], [address] [place], [place] [address]      Scheduled Meds:  folic acid  1 mg Oral Daily   multivitamin with minerals  1 tablet Oral Daily   thiamine  100 mg Oral Daily   Or   thiamine  100 mg Intravenous Daily   Continuous Infusions:  sodium chloride 20 mL/hr at 01/30/21 0539   0.9 % NaCl with KCl 20 mEq / L 75 mL/hr at 01/30/21 0718   cefTRIAXone (ROCEPHIN)  IV Stopped (01/29/21 1645)   octreotide  (SANDOSTATIN)    IV infusion 50 mcg/hr (01/30/21 0204)     LOS: 1 day   Lonia Blood, MD [facility] Office  [phone] Pager - Text Page per Loretha Stapler  If 7PM-7AM, please contact night-coverage per Amion 01/30/2021, 10:42 AM

## 2021-01-30 NOTE — Anesthesia Postprocedure Evaluation (Signed)
Anesthesia Post Note  Patient: Cody [name]  Procedure(s) Performed: ESOPHAGOGASTRODUODENOSCOPY (EGD) WITH PROPOFOL  Patient location during evaluation: Phase II Anesthesia Type: General Level of consciousness: awake Pain management: pain level controlled Vital Signs Assessment: post-procedure vital signs reviewed and stable Respiratory status: spontaneous breathing and respiratory function stable Cardiovascular status: blood pressure returned to baseline and stable Postop Assessment: no headache and no apparent nausea or vomiting Anesthetic complications: no Comments: Late entry   No notable events documented.   Last Vitals:  Vitals:   01/30/21 1330 01/30/21 1350  BP: 118/78 107/76  Pulse: 86 87  Resp: 14 16  Temp: 36.7 C 37.1 C  SpO2: 100% 100%    Last Pain:  Vitals:   01/30/21 1350  TempSrc:   PainSc: 0-No pain                 Windell Norfolk

## 2021-01-30 NOTE — Progress Notes (Signed)
Patient does not want me to notify contacts that he is going to inpatient bed 312.

## 2021-01-31 ENCOUNTER — Encounter (HOSPITAL_COMMUNITY): Payer: Self-pay | Admitting: Internal Medicine

## 2021-01-31 LAB — COMPREHENSIVE METABOLIC PANEL
ALT: 36 U/L (ref 0–44)
AST: 119 U/L — ABNORMAL HIGH (ref 15–41)
Albumin: 3 g/dL — ABNORMAL LOW (ref 3.5–5.0)
Alkaline Phosphatase: 86 U/L (ref 38–126)
Anion gap: 5 (ref 5–15)
BUN: 15 mg/dL (ref 6–20)
CO2: 26 mmol/L (ref 22–32)
Calcium: 8.3 mg/dL — ABNORMAL LOW (ref 8.9–10.3)
Chloride: 110 mmol/L (ref 98–111)
Creatinine, Ser: 0.92 mg/dL (ref 0.61–1.24)
GFR, Estimated: >60 mL/min (ref >60)
Glucose, Bld: 97 mg/dL (ref 70–99)
Potassium: 3.6 mmol/L (ref 3.5–5.1)
Sodium: 141 mmol/L (ref 135–145)
Total Bilirubin: 2.8 mg/dL — ABNORMAL HIGH (ref 0.3–1.2)
Total Protein: 7 g/dL (ref 6.5–8.1)

## 2021-01-31 LAB — CBC
HCT: 32.4 % — ABNORMAL LOW (ref 39.0–52.0)
Hemoglobin: 10.7 g/dL — ABNORMAL LOW (ref 13.0–17.0)
MCH: 31 pg (ref 26.0–34.0)
MCHC: 33 g/dL (ref 30.0–36.0)
MCV: 93.9 fL (ref 80.0–100.0)
Platelets: 139 10*3/uL — ABNORMAL LOW (ref 150–400)
RBC: 3.45 MIL/uL — ABNORMAL LOW (ref 4.22–5.81)
RDW: 15.2 % (ref 11.5–15.5)
WBC: 7.5 10*3/uL (ref 4.0–10.5)
nRBC: 0 % (ref 0.0–0.2)

## 2021-01-31 LAB — PROTIME-INR
INR: 1.3 — ABNORMAL HIGH (ref 0.8–1.2)
Prothrombin Time: 15.9 seconds — ABNORMAL HIGH (ref 11.4–15.2)

## 2021-01-31 LAB — AFP TUMOR MARKER: AFP, Serum, Tumor Marker: 4.9 ng/mL (ref 0.0–6.9)

## 2021-01-31 MED ORDER — PANTOPRAZOLE SODIUM 40 MG IV SOLR
40.0000 mg | Freq: Two times a day (BID) | INTRAVENOUS | Status: AC
  Administered 2021-01-31 (×2): 40 mg via INTRAVENOUS
  Filled 2021-01-31 (×2): qty 40

## 2021-01-31 MED ORDER — MELATONIN 3 MG PO TABS
6.0000 mg | ORAL_TABLET | Freq: Once | ORAL | Status: AC
  Administered 2021-01-31: 3 mg via ORAL
  Filled 2021-01-31: qty 2

## 2021-01-31 MED ORDER — PANTOPRAZOLE SODIUM 40 MG PO TBEC
40.0000 mg | DELAYED_RELEASE_TABLET | Freq: Two times a day (BID) | ORAL | Status: DC
  Administered 2021-02-01: 40 mg via ORAL
  Filled 2021-01-31: qty 1

## 2021-01-31 NOTE — Progress Notes (Signed)
Cody [name]  [id number]:[id number] DOB: February 03, 1971 DOA: 01/29/2021 PCP: [facility], [facility]    Brief Narrative:  50 year old with a history of alcoholic cirrhosis complicated by esophageal varices requiring previous banding x2, LI-RADS 3 liver lesion not previously worked up due to noncompliance, and HTN who presented to the ED with hematemesis which had been occurring since 01/27/2021.  He admitted to drinking vodka on a daily basis.  In the ED he was hemodynamically stable with a hemoglobin of 13.4.  Consultants:  Gastroenterology  Code Status: FULL CODE  Antimicrobials:  None  DVT prophylaxis: SCDs  Subjective: Afeb. Vitals stable. No acute events noted overnight. Hgb only mildly decreased, coincident w/ hydration. Tolerated EGD well. No new complaints. Reports ongoing nausea w/ attempts to eat.   Assessment & Plan:  Upper GI bleed EGD 9/8 noted 3 columns of grade 1 esophageal varices which completely flattened with insufflation, scarring noted from previous banding, 1 superficial linear ulcer noted at the distal end of the longest variz, PHG throughout the entire stomach, but no stigmata or active bleeding - per GI will need aggressive twice daily PPI with repeat EGD in 8-12 weeks - avoid NSAIDs and alcohol  Anemia due to acute blood loss W/ recent bleeding and hydration Hgb has dropped - no indication of active bleeding at this time - recheck CBC in AM  Known liver lesion Originally noted on MRI Jan 2021 - f/u imaging this admit reveals "stable segment V/VIII hepatic lesion which demonstrates minimal peripheral nodular arterial phase enhancement which increases throughout  delayed phases of imaging, again consistent with a LI-RADS 3 lesion" but no new suspicious lesions - no indication for urgent inpatient evaluation - further outpt care as per GI recs   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   Difficult to place patient No    Objective: Blood pressure 122/80, pulse 89, temperature 98 F (36.7 C), resp. rate 20, height 5\' 10"  (1.778 m), weight 85 kg, SpO2 99 %.  Intake/Output Summary (Last 24 hours) at 01/31/2021 0835 Last data filed at 01/30/2021 2358 Gross per 24 hour  Intake 3127.38 ml  Output --  Net 3127.38 ml    Filed Weights   01/29/21 0906  Weight: 85 kg    Examination: General: No acute respiratory distress Lungs: Clear to auscultation B Cardiovascular: Regular rate and rhythm without murmur  Abdomen: NT/ND, soft, no mass, no rebound  Extremities: No significant C/C/E B LE   CBC: Recent Labs  Lab 01/29/21 0921 01/30/21 0425 01/31/21 0528  WBC 10.2 11.9* 7.5  HGB 13.4 11.5* 10.7*  HCT 39.7 33.9* 32.4*  MCV 91.3 90.2 93.9  PLT 222 176 139*    Basic Metabolic Panel: Recent Labs  Lab 01/29/21 0921 01/30/21 0425 01/31/21 0528  NA 139 140 141  K 3.7 3.9 3.6  CL  107 110 110  CO2 23 22 26   GLUCOSE 163* 108* 97  BUN 27* 35* 15  CREATININE 1.23 1.17 0.92  CALCIUM 8.8* 8.4* 8.3*    GFR: Estimated Creatinine Clearance: 99.2 mL/min (by C-G formula based on SCr of 0.92 mg/dL).  Liver Function Tests: Recent Labs  Lab 01/29/21 0921 01/30/21 0425 01/31/21 0528  AST 63* 51* 119*  ALT 29 22 36  ALKPHOS 116 92 86  BILITOT 3.0* 3.0* 2.8*  PROT 8.0 7.1 7.0  ALBUMIN 3.4* 3.0* 3.0*     Coagulation Profile: Recent Labs  Lab 01/29/21 0924 01/30/21 0425 01/31/21 0528   INR 1.3* 1.3* 1.3*      HbA1C: Hgb A1c MFr Bld  Date/Time Value Ref Range Status  05/02/2019 09:44 PM 6.1 (H) 4.8 - 5.6 % Final    Comment:    (NOTE) Pre diabetes:          5.7%-6.4% Diabetes:              >6.4% Glycemic control for   <7.0% adults with diabetes      Recent Results (from the past 240 hour(s))  Resp Panel by RT-PCR (Flu A&B, Covid) Nasopharyngeal Swab     Status: None   Collection Time: 01/29/21  3:55 PM   Specimen: Nasopharyngeal Swab; Nasopharyngeal(NP) swabs in vial transport medium  Result Value Ref Range Status   SARS Coronavirus 2 by RT PCR NEGATIVE NEGATIVE Final    Comment: (NOTE) SARS-CoV-2 target nucleic acids are NOT DETECTED.  The SARS-CoV-2 RNA is generally detectable in upper respiratory specimens during the acute phase of infection. The lowest concentration of SARS-CoV-2 viral copies this assay can detect is 138 copies/mL. A negative result does not preclude SARS-Cov-2 infection and should not be used as the sole basis for treatment or other patient management decisions. A negative result may occur with  improper specimen collection/handling, submission of specimen other than nasopharyngeal swab, presence of viral mutation(s) within the areas targeted by this assay, and inadequate number of viral copies(<138 copies/mL). A negative result must be combined with clinical observations, patient history, and epidemiological information. The expected result is Negative.  Fact Sheet for Patients:  03/31/21  Fact Sheet for Healthcare Providers:  BloggerCourse.com  This test is no t yet approved or cleared by the [place]ious[place] FDA and  has been authorized for detection and/or diagnosis of SARS-CoV-2 by FDA under an Emergency Use Authorization (EUA). This EUA will remain  in effect (meaning this test can be used) for the duration of the COVID-19 declaration under Section 564(b)(1) of the  Act, 21 U.S.C.section 360bbb-3(b)(1), unless the authorization is terminated  or revoked sooner.       Influenza A by PCR NEGATIVE NEGATIVE Final   Influenza B by PCR NEGATIVE NEGATIVE Final    Comment: (NOTE) The Xpert Xpress SARS-CoV-2/FLU/RSV plus assay is intended as an aid in the diagnosis of influenza from Nasopharyngeal swab specimens and should not be used as a sole basis for treatment. Nasal washings and aspirates are unacceptable for Xpert Xpress SARS-CoV-2/FLU/RSV testing.  Fact Sheet for Patients: [place]  Fact Sheet for Healthcare Providers: BloggerCourse.com  This test is not yet approved or cleared by the SeriousBroker.it FDA and has been authorized for detection and/or diagnosis of SARS-CoV-2 by FDA under an Emergency Use Authorization (EUA). This EUA will remain in effect (meaning this test can be used) for the duration of the COVID-19 declaration under Section 564(b)(1) of the Act, 21 U.S.C. section 360bbb-3(b)(1), unless  the authorization is terminated or revoked.  Performed at [facility], [address], [place], [place] [address]      Scheduled Meds:  folic acid  1 mg Oral Daily   multivitamin with minerals  1 tablet Oral Daily   thiamine  100 mg Oral Daily   Continuous Infusions:  0.9 % NaCl with KCl 20 mEq / L 75 mL/hr at 01/30/21 2358   cefTRIAXone (ROCEPHIN)  IV 1 g (01/30/21 1454)     LOS: 2 days   Lonia Blood, MD [facility] Office  [phone] Pager - Text Page per Loretha Stapler  If 7PM-7AM, please contact night-coverage per Amion 01/31/2021, 8:35 AM

## 2021-01-31 NOTE — TOC Initial Note (Signed)
Transition of Care [facility]) - Initial/Assessment Note    Patient Details  Name: Cody [name] MRN: [id number] Date of Birth: 09/01/1970  [facility] CM/SW Contact:    Villa Herb, LCSWA Phone Number: 01/31/2021, 2:55 PM  Clinical Narrative:                 [facility] consulted for substnace use resources/education. CSW spoke with pt to complete assessment. Pt lives with his parents. Pt is independent in completing his ADLs and provides his own transportation. Pt has not had HH services and he does not use any DME. CSW inquired about pts interest in SA resources, pt is not interested. TOC signing off.   Expected Discharge Plan: Home/Self Care Barriers to Discharge: Continued Medical Work up   Patient Goals and CMS Choice Patient states their goals for this hospitalization and ongoing recovery are:: Return home CMS Medicare.gov Compare Post Acute Care list provided to:: Patient Choice offered to / list presented to : Patient  Expected Discharge Plan and Services Expected Discharge Plan: Home/Self Care In-house Referral: Clinical Social Work Discharge Planning Services: CM Consult   Living arrangements for the past 2 months: Single Family Home                                      Prior Living Arrangements/Services Living arrangements for the past 2 months: Single Family Home Lives with:: Parents Patient language and need for interpreter reviewed:: Yes Do you feel safe going back to the place where you live?: Yes      Need for Family Participation in Patient Care: No (Comment) Care giver support system in place?: Yes (comment)   Criminal Activity/Legal Involvement Pertinent to Current Situation/Hospitalization: No - Comment as needed  Activities of Daily Living Home Assistive Devices/Equipment: None ADL Screening (condition at time of admission) Patient's cognitive ability adequate to safely complete daily activities?: Yes Is the patient deaf or have  difficulty hearing?: No Does the patient have difficulty seeing, even when wearing glasses/contacts?: No Does the patient have difficulty concentrating, remembering, or making decisions?: No Patient able to express need for assistance with ADLs?: Yes Does the patient have difficulty dressing or bathing?: No Independently performs ADLs?: Yes (appropriate for developmental age) Does the patient have difficulty walking or climbing stairs?: No Weakness of Legs: None Weakness of Arms/Hands: None  Permission Sought/Granted                  Emotional Assessment Appearance:: Appears stated age Attitude/Demeanor/Rapport: Engaged Affect (typically observed): Accepting Orientation: : Oriented to Self, Oriented to Place, Oriented to  Time, Oriented to Situation Alcohol / Substance Use: Not Applicable Psych Involvement: No (comment)  Admission diagnosis:  Upper GI bleed [K92.2] Patient Active Problem List   Diagnosis Date Noted   Upper GI bleed 01/29/2021   Alcoholic cirrhosis of liver without ascites (HCC)    Esophageal varices with bleeding in diseases classified elsewhere (HCC)    Elevated liver enzymes    GI bleed 05/02/2019   Esophageal varices with bleeding (HCC) 05/02/2019   Alcohol abuse 05/02/2019   Hematemesis with nausea    GERD 07/20/2008   GENERALIZED ANXIETY DISORDER 11/03/2007   ALCOHOL USE 11/03/2007   Essential hypertension 11/03/2007   PCP:  [facility], [facility] Public Pharmacy:  No Pharmacies Listed    Social Determinants of Health (SDOH) Interventions    Readmission Risk Interventions Readmission Risk  Prevention Plan 01/31/2021  Medication Screening Complete  Transportation Screening Complete  Some recent data might be hidden

## 2021-01-31 NOTE — Progress Notes (Signed)
Subjective: Small black stool last night. No BM today. Little nausea after taking medications this morning. No vomiting. No abdominal pain, change in mental status, swelling in the abdomen or LE. Doing fine with clear liquids. Ready to advance diet.   Asking about MRI result.   Objective: Vital signs in last 24 hours: Temp:  [98 F (36.7 C)-98.1 F (36.7 C)] 98.1 F (36.7 C) (09/09 1200) Pulse Rate:  [89-107] 107 (09/09 1200) Resp:  [18-20] 18 (09/09 1200) BP: (122-139)/(80-87) 139/87 (09/09 1200) SpO2:  [99 %] 99 % (09/09 0337) Last BM Date: 01/29/21 General:   Alert and oriented, pleasant, NAD Head:  Normocephalic and atraumatic. Eyes:  No icterus, sclera clear. Conjuctiva pink.  Abdomen:  Bowel sounds present, soft, non-tender, non-distended. No HSM or hernias noted. No rebound or guarding. No masses appreciated  Msk:  Symmetrical without gross deformities. Normal posture. Extremities:  Without edema. Neurologic:  Alert and  oriented x4;  grossly normal neurologically. No asterixis.  Skin:  Warm and dry, intact without significant lesions.  Psych: Normal mood and affect.  Intake/Output from previous day: 09/08 0701 - 09/09 0700 In: 3127.4 [I.V.:3127.4] Out: -  Intake/Output this shift: No intake/output data recorded.  Lab Results: Recent Labs    01/29/21 0921 01/30/21 0425 01/31/21 0528  WBC 10.2 11.9* 7.5  HGB 13.4 11.5* 10.7*  HCT 39.7 33.9* 32.4*  PLT 222 176 139*   BMET Recent Labs    01/29/21 0921 01/30/21 0425 01/31/21 0528  NA 139 140 141  K 3.7 3.9 3.6  CL 107 110 110  CO2 23 22 26   GLUCOSE 163* 108* 97  BUN 27* 35* 15  CREATININE 1.23 1.17 0.92  CALCIUM 8.8* 8.4* 8.3*   LFT Recent Labs    01/29/21 0921 01/30/21 0425 01/31/21 0528  PROT 8.0 7.1 7.0  ALBUMIN 3.4* 3.0* 3.0*  AST 63* 51* 119*  ALT 29 22 36  ALKPHOS 116 92 86  BILITOT 3.0* 3.0* 2.8*   PT/INR Recent Labs    01/30/21 0425 01/31/21 0528  LABPROT 16.1* 15.9*  INR  1.3* 1.3*    Studies/Results: MR ABDOMEN W WO CONTRAST  Result Date: 01/30/2021 CLINICAL DATA:  Hepatic cirrhosis. Follow-up hepatic lesions seen January 2021 MRI. EXAM: MRI ABDOMEN WITHOUT AND WITH CONTRAST TECHNIQUE: Multiplanar multisequence MR imaging of the abdomen was performed both before and after the administration of intravenous contrast. CONTRAST:  7.22mL GADAVIST GADOBUTROL 1 MMOL/ML IV SOLN COMPARISON:  MRI June 06, 2019 FINDINGS: Lower chest: No acute abnormality. Hepatobiliary: Inferior tip of the right lobe of the liver is not included on axial imaging. Cirrhotic hepatic morphology. Slightly heterogeneous enhancement of the hepatic parenchyma is again seen. No significant change ill-defined segment V/VIII hepatic lesion which demonstrates minimal peripheral slightly nodular arterial phase enhancement which increases throughout delayed phases of imaging measuring 3.1 x 2.9 cm on image 53/21 previously 3.2 x 2.8 cm. Also unchanged are the 2 adjacent internal cystic areas with the superior area measuring 11 mm on image 18/5 and the inferior area both of measuring 18 mm on image 19/5, which demonstrate a well-circumscribed T2 hypointense rim. Subcentimeter hepatic cysts.  No new suspicious hepatic lesion. Pancreas: No mass, inflammatory changes, or other parenchymal abnormality identified. Spleen:  No splenomegaly. Adrenals/Urinary Tract: Bilateral adrenal glands are unremarkable. No hydronephrosis. No solid enhancing renal mass. Stomach/Bowel: Or large bowel in the abdomen. Colonic diverticulosis without findings of acute diverticulitis. Vascular/Lymphatic: The portal, splenic and superior mesenteric veins appear patent. No  abdominal aortic aneurysm. No pathologically enlarged abdominal lymph nodes. No pathologic dilation of small Other:  No abdominal ascites. Musculoskeletal: No suspicious bone lesions identified. IMPRESSION: 1. Cirrhotic hepatic morphology. 2. Stable segment V/VIII hepatic  lesion which demonstrates minimal peripheral nodular arterial phase enhancement which increases throughout delayed phases of imaging, again consistent with a LI-RADS 3 lesion. 3. No new suspicious hepatic lesion. 4. Colonic diverticulosis without findings of acute diverticulitis. Electronically Signed   By: Maudry Mayhew M.D.   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  He was initially started on octreotide infusion and underwent EGD 01/30/2021 with grade 1 esophageal varices without bleeding or stigmata of recent bleeding, esophageal ulcer without bleeding or stigmata of recent bleeding, portal hypertensive gastropathy, erythematous duodenopathy.  Octreotide was discontinued and recommended PPI twice daily.  Clinically, he is doing well today.  Small dark bowel movement yesterday, but no BM today.  Hemoglobin declined to 10.7 today from 11.5 yesterday.  This may be reflecting equilibration.  Continue to monitor.  Cirrhosis: Secondary to alcohol.  Diagnosed 2020.  Clinically remained stable today. MELD  13. On Rocephin for SBP prophylaxis in the setting of GI bleeding.  He will need to complete a 7-day course.   LI-RADS category 3 lesion: MRI abdomen with and without contrast yesterday to follow-up on \\known  LI-RADS category 3 lesion which again was consistent with a LI-RADS 3 lesion.  His AFP has remained normal.  Suspect he needs biopsy of this lesion.  He was previously recommended to have consultation with oncology, but patient declined referral.  Will discuss with Dr. Marletta Lor.  We may need to consider arranging outpatient liver biopsy rather than waiting on oncology evaluation.  Plan: 1.  Advance to full  liquids.  2.  PPI BID 3.  Complete 7-day course of antibiotics for SBP prophylaxis. 4.  Monitor H/H and for overt GI bleeding.  5.  Will discuss MRI findings with Dr. Marletta Lor.  Patient likely needs outpatient liver biopsy for LI-RADS category 3 lesion versus referral back to oncology.  6.  Consider nonspecific beta-blocker given history of varices.  7.  Daily LFTs and INR while inpatient.   8.  Follow-up outpatient for ongoing cirrhosis care. 9.  Counseled on strict alcohol avoidance. 10.  Anticipate discharge tomorrow.    LOS: 2 days    01/31/2021, 3:06 PM   Ermalinda Memos, [facility]

## 2021-02-01 ENCOUNTER — Telehealth: Payer: Self-pay | Admitting: Internal Medicine

## 2021-02-01 LAB — CBC
HCT: 32.8 % — ABNORMAL LOW (ref 39.0–52.0)
Hemoglobin: 11.1 g/dL — ABNORMAL LOW (ref 13.0–17.0)
MCH: 31.4 pg (ref 26.0–34.0)
MCHC: 33.8 g/dL (ref 30.0–36.0)
MCV: 92.7 fL (ref 80.0–100.0)
Platelets: 136 10*3/uL — ABNORMAL LOW (ref 150–400)
RBC: 3.54 MIL/uL — ABNORMAL LOW (ref 4.22–5.81)
RDW: 15.1 % (ref 11.5–15.5)
WBC: 6.8 10*3/uL (ref 4.0–10.5)
nRBC: 0 % (ref 0.0–0.2)

## 2021-02-01 LAB — PROTIME-INR
INR: 1.3 — ABNORMAL HIGH (ref 0.8–1.2)
Prothrombin Time: 15.9 seconds — ABNORMAL HIGH (ref 11.4–15.2)

## 2021-02-01 MED ORDER — CIPROFLOXACIN HCL 250 MG PO TABS
500.0000 mg | ORAL_TABLET | Freq: Every day | ORAL | Status: DC
  Administered 2021-02-01: 500 mg via ORAL
  Filled 2021-02-01: qty 2

## 2021-02-01 MED ORDER — PANTOPRAZOLE SODIUM 40 MG PO TBEC: 40.0000 mg | DELAYED_RELEASE_TABLET | Freq: Two times a day (BID) | ORAL | 1 refills | Status: AC

## 2021-02-01 MED ORDER — NADOLOL 20 MG PO TABS: 20.0000 mg | ORAL_TABLET | Freq: Every day | ORAL | 1 refills | Status: AC

## 2021-02-01 MED ORDER — CIPROFLOXACIN HCL 500 MG PO TABS: 500.0000 mg | ORAL_TABLET | Freq: Every day | ORAL | 0 refills | Status: AC

## 2021-02-01 NOTE — Discharge Summary (Signed)
DISCHARGE SUMMARY  Cody [name]  MR#: [id number]  DOB:1970-08-06  Date of Admission: 01/29/2021 Date of Discharge: 02/01/2021  Attending Physician:Bethaney Oshana Silvestre Gunner, MD  Patient's MGQ:[id number], [facility]  Consults: Gastroenterology  Disposition: Discharge home   Follow-up Appts:  Follow-up Information     [facility], [facility] Follow up in 1 week(s).   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  He admitted to drinking vodka on a daily basis.  In the ED he was hemodynamically stable with a hemoglobin of 13.4.  Hospital Course:  Upper GI bleed EGD 9/8 noted 3 columns of grade 1 esophageal varices which completely flattened with insufflation, scarring noted from previous banding, 1 superficial linear ulcer noted at the distal end of the longest varix, PHG throughout the entire stomach, but no stigmata or active bleeding - per GI will need aggressive twice daily PPI with repeat EGD in 8-12 weeks -counseled to avoid NSAIDs and alcohol   Anemia due to acute blood loss W/ recent bleeding and hydration Hgb has dropped - no indication of active bleeding at time of his discharge or at time  of endoscopy with hemoglobin stabilizing   Known liver lesion Originally noted on MRI Jan 2021 - f/u imaging this admit reveals "stable segment V/VIII hepatic lesion which demonstrates minimal peripheral nodular arterial phase enhancement which increases throughout  delayed phases of imaging, again consistent with a LI-RADS 3 lesion" but no new suspicious lesions - no indication for urgent inpatient evaluation - further outpt care as per GI recs to include planning for biopsy   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 Counseled on absolute need to discontinue alcohol and the consequences of not doing so -explained very clearly to patient that ongoing use of alcohol will lead to fulminant liver failure and possible bleeding to death -patient was offered counseling and assistance in obtaining outpatient assistance via social work and declined   Active alcohol abuse No evidence of signif withdrawal during this admission   HTN Blood pressure well controlled at time of discharge   Hyperglycemia No hx of DM - mild elevation on BMET - f/u as outpt     Allergies as of 02/01/2021   No Known Allergies      Medication List     STOP taking these medications    amLODipine 5 MG tablet Commonly known as: NORVASC   metoprolol tartrate 50 MG tablet Commonly known as: LOPRESSOR       TAKE these medications    ciprofloxacin 500 MG tablet Commonly known as: CIPRO Take 1 tablet (500 mg total) by mouth daily with breakfast for 3 days.   multivitamin with minerals Tabs tablet Take 1 tablet by mouth daily.   nadolol 20 MG tablet  Commonly known as: CORGARD Take 1 tablet (20 mg total) by mouth daily.   pantoprazole 40 MG tablet Commonly known as: PROTONIX Take 1 tablet (40 mg total) by mouth 2 (two) times daily before a meal. What changed:  how much to take how to take this when to take this additional instructions        Day of Discharge BP 128/87 (BP Location: Left Arm)   Pulse  84   Temp 98.4 F (36.9 C) (Oral)   Resp 19   Ht 5\' 10"  (1.778 m)   Wt 85 kg   SpO2 100%   BMI 26.89 kg/m   Physical Exam: General: No acute respiratory distress Lungs: Clear to auscultation bilaterally without wheezes or crackles Cardiovascular: Regular rate and rhythm without murmur gallop or rub normal S1 and S2 Abdomen: Nontender, nondistended, soft, bowel sounds positive, no rebound, no ascites, no appreciable mass Extremities: No significant cyanosis, clubbing, or edema bilateral lower extremities  Basic Metabolic Panel: Recent Labs  Lab 01/29/21 0921 01/30/21 0425 01/31/21 0528  NA 139 140 141  K 3.7 3.9 3.6  CL 107 110 110  CO2 23 22 26   GLUCOSE 163* 108* 97  BUN 27* 35* 15  CREATININE 1.23 1.17 0.92  CALCIUM 8.8* 8.4* 8.3*    Liver Function Tests: Recent Labs  Lab 01/29/21 0921 01/30/21 0425 01/31/21 0528  AST 63* 51* 119*  ALT 29 22 36  ALKPHOS 116 92 86  BILITOT 3.0* 3.0* 2.8*  PROT 8.0 7.1 7.0  ALBUMIN 3.4* 3.0* 3.0*     Coags: Recent Labs  Lab 01/29/21 0924 01/30/21 0425 01/31/21 0528 02/01/21 0558  INR 1.3* 1.3* 1.3* 1.3*     CBC: Recent Labs  Lab 01/29/21 0921 01/30/21 0425 01/31/21 0528 02/01/21 0558  WBC 10.2 11.9* 7.5 6.8  HGB 13.4 11.5* 10.7* 11.1*  HCT 39.7 33.9* 32.4* 32.8*  MCV 91.3 90.2 93.9 92.7  PLT 222 176 139* 136*     Recent Results (from the past 240 hour(s))  Resp Panel by RT-PCR (Flu A&B, Covid) Nasopharyngeal Swab     Status: None   Collection Time: 01/29/21  3:55 PM   Specimen: Nasopharyngeal Swab; Nasopharyngeal(NP) swabs in vial transport medium  Result Value Ref Range Status   SARS Coronavirus 2 by RT PCR NEGATIVE NEGATIVE Final    Comment: (NOTE) SARS-CoV-2 target nucleic acids are NOT DETECTED.  The SARS-CoV-2 RNA is generally detectable in upper respiratory specimens during the acute phase of infection. The lowest concentration of SARS-CoV-2 viral copies this assay can detect is 138 copies/mL.  A negative result does not preclude SARS-Cov-2 infection and should not be used as the sole basis for treatment or other patient management decisions. A negative result may occur with  improper specimen collection/handling, submission of specimen other than nasopharyngeal swab, presence of viral mutation(s) within the areas targeted by this assay, and inadequate number of viral copies(<138 copies/mL). A negative result must be combined with clinical observations, patient history, and epidemiological information. The expected result is Negative.  Fact Sheet for Patients:  04/03/21  Fact Sheet for Healthcare Providers:  03/31/21  This test is no t yet approved or cleared by the [employer] FDA and  has been authorized for detection and/or diagnosis of SARS-CoV-2 by FDA under an Emergency Use Authorization (EUA). This EUA will remain  in effect (meaning this test can be used) for the duration of the COVID-19 declaration under Section 564(b)(1) of the Act, 21 U.S.C.section 360bbb-3(b)(1), unless the  authorization is terminated  or revoked sooner.       Influenza A by PCR NEGATIVE NEGATIVE Final   Influenza B by PCR NEGATIVE NEGATIVE Final    Comment: (NOTE) The Xpert Xpress SARS-CoV-2/FLU/RSV plus assay is intended as an aid in the diagnosis of influenza from Nasopharyngeal swab specimens and should not be used as a sole basis for treatment. Nasal washings and aspirates are unacceptable for Xpert Xpress SARS-CoV-2/FLU/RSV testing.  Fact Sheet for Patients: [employer]com  Fact Sheet for Healthcare Providers: Serious[employer]ker.it  This test is not yet approved or cleared by the [place] FDA and has been authorized for detection and/or diagnosis of SARS-CoV-2 by FDA under an Emergency Use Authorization (EUA). This EUA will remain in effect (meaning this test  can be used) for the duration of the COVID-19 declaration under Section 564(b)(1) of the Act, 21 U.S.C. section 360bbb-3(b)(1), unless the authorization is terminated or revoked.  Performed at [facility], [address] [place], [place] [address]       Time spent in discharge (includes decision making & examination of pt): 35 minutes  02/01/2021, 12:13 PM   Lonia Blood, MD [facility] Office  [phone]

## 2021-02-01 NOTE — Telephone Encounter (Signed)
Please arrange hospital follow-up visit for this patient with either Dr. Jena Gauss or one of the apps.  Thank you

## 2021-02-03 ENCOUNTER — Encounter: Payer: Self-pay | Admitting: Internal Medicine

## 2021-02-04 NOTE — Telephone Encounter (Signed)
OV made °

## 2021-06-12 NOTE — Progress Notes (Deleted)
Referring Provider: Health, [facility] Folks* Primary Care Physician:  [facility], [facility] Primary GI Physician: Dr. Jena Gauss  No chief complaint on file.   HPI:   Cody [name] is a 51 y.o. male presenting today presenting today for hospital follow-up.    He has history of alcoholic cirrhosis complicated by esophageal varices requiring banding previously (2020 and 2021), also with L-RADS category 3 lesion, erosive reflux esophagitis, ongoing daily alcohol use who presented to the emergency room on 9/7 with hematemesis and melena.  Underwent EGD on 9/8 revealing grade 1 esophageal varices without bleeding or stigmata of recent bleeding, esophageal ulcer without bleeding or stigmata of recent bleeding, portal hypertensive gastropathy, erythematous duodenopathy.  Recommended PPI twice daily.  He also received a course of antibiotics for SBP prophylaxis.  Also had MRI abdomen with and without contrast to follow-up on known L-RADS category 3 lesion which again was consistent with L-RADS 3.  His AFP remains normal.  Patient would likely need outpatient biopsy.  Had previously been referred to oncology, but declined.  Previously saw IR in Feb 2021 who did not think his liver lesion was highly suspicious for [facility] given MRI characteristics and normal AFP.  Stated biopsy would potentially be difficult given it was not seen by ultrasound and vaguely enhanced by CT and MRI.  Stated this could potentially represent some type of benign perfusion abnormality/altered region of perfusion.  They had recommended repeat imaging in 3 months.  Today:    Consider nonspecific beta-blocker due to history of varices.  Vs timing of next EGD.  Consider liver biopsy vs seeing oncology.  Check hep B core Ab Consider checking all labs to update MELD MRI in march   Past Medical History:  Diagnosis Date   Hypertension     Past Surgical History:  Procedure Laterality Date   ESOPHAGEAL BANDING N/A  06/05/2019   Procedure: ESOPHAGEAL BANDING;  Surgeon: Corbin Ade, MD;  [facility];  Service: Endoscopy;  Laterality: N/A;   ESOPHAGOGASTRODUODENOSCOPY  02/10/2007   Dr. Jena Gauss; distal esophageal erosions consistent with reflux esophagitis, moderate sized hiatal hernia, submucosal gastric hemorrhage.   ESOPHAGOGASTRODUODENOSCOPY (EGD) WITH PROPOFOL N/A 05/02/2019   Procedure: ESOPHAGOGASTRODUODENOSCOPY (EGD) WITH PROPOFOL;  Surgeon: West Bali, MD;  Location: [facility];  Service: Endoscopy;  Laterality: N/A;   ESOPHAGOGASTRODUODENOSCOPY (EGD) WITH PROPOFOL N/A 06/05/2019   Procedure: ESOPHAGOGASTRODUODENOSCOPY (EGD) WITH PROPOFOL;  Surgeon: Corbin Ade, MD;  Location: [facility];  Service: Endoscopy;  Laterality: N/A;  2:45pm - pt can not come earlier   ESOPHAGOGASTRODUODENOSCOPY (EGD) WITH PROPOFOL N/A 01/30/2021   Procedure: ESOPHAGOGASTRODUODENOSCOPY (EGD) WITH PROPOFOL;  Surgeon: Lanelle Bal, DO;  Location: [facility];  Service: Endoscopy;  Laterality: N/A;   IR RADIOLOGIST EVAL & MGMT  06/28/2019   TONSILLECTOMY  childhood    Current Outpatient Medications  Medication Sig Dispense Refill   Multiple Vitamin (MULTIVITAMIN WITH MINERALS) TABS tablet Take 1 tablet by mouth daily. (Patient not taking: Reported on 01/29/2021) 30 tablet 3   nadolol (CORGARD) 20 MG tablet Take 1 tablet (20 mg total) by mouth daily. 30 tablet 1   pantoprazole (PROTONIX) 40 MG tablet Take 1 tablet (40 mg total) by mouth 2 (two) times daily before a meal. 60 tablet 1   No current facility-administered medications for this visit.    Allergies as of 06/13/2021   (No Known Allergies)    Family History  Problem Relation Age of Onset   Colon  cancer Neg Hx    Stomach cancer Neg Hx    Esophageal cancer Neg Hx     Social History   Socioeconomic History   Marital status: Single    Spouse name: Not on file   Number of children: Not on file   Years of education: Not on file    Highest education level: Not on file  Occupational History   Not on file  Tobacco Use   Smoking status: Former    Packs/day: 2.00    Years: 15.00    Pack years: 30.00    Types: Cigarettes    Quit date: 05/31/1998    Years since quitting: 23.0   Smokeless tobacco: Never  Vaping Use   Vaping Use: Never used  Substance and Sexual Activity   Alcohol use: Yes    Comment: 2 shots of vodka a day   Drug use: No   Sexual activity: Yes  Other Topics Concern   Not on file  Social History Narrative   Not on file   Social Determinants of Health   Financial Resource Strain: Not on file  Food Insecurity: Not on file  Transportation Needs: Not on file  Physical Activity: Not on file  Stress: Not on file  Social Connections: Not on file    Review of Systems: Gen: Denies fever, chills, anorexia. Denies fatigue, weakness, weight loss.  CV: Denies chest pain, palpitations, syncope, peripheral edema, and claudication. Resp: Denies dyspnea at rest, cough, wheezing, coughing up blood, and pleurisy. GI: Denies vomiting blood, jaundice, and fecal incontinence.   Denies dysphagia or odynophagia. Derm: Denies rash, itching, dry skin Psych: Denies depression, anxiety, memory loss, confusion. No homicidal or suicidal ideation.  Heme: Denies bruising, bleeding, and enlarged lymph nodes.  Physical Exam: There were no vitals taken for this visit. General:   Alert and oriented. No distress noted. Pleasant and cooperative.  Head:  Normocephalic and atraumatic. Eyes:  Conjuctiva clear without scleral icterus. Mouth:  Oral mucosa pink and moist. Good dentition. No lesions. Heart:  S1, S2 present without murmurs appreciated. Lungs:  Clear to auscultation bilaterally. No wheezes, rales, or rhonchi. No distress.  Abdomen:  +BS, soft, non-tender and non-distended. No rebound or guarding. No HSM or masses noted. Msk:  Symmetrical without gross deformities. Normal posture. Extremities:  Without  edema. Neurologic:  Alert and  oriented x4 Psych:  Alert and cooperative. Normal mood and affect.

## 2021-06-13 ENCOUNTER — Ambulatory Visit: Payer: Self-pay | Admitting: Gastroenterology

## 2023-07-05 ENCOUNTER — Telehealth: Payer: Self-pay

## 2023-07-05 NOTE — Telephone Encounter (Signed)
 Attempted wellness call to [facility] client no answer and unable to leave a vm.  Last seen at [facility] was 09/02/22 no future appointments showing.   Kris Pester RN [employer]
